# Patient Record
Sex: Male | Born: 1960 | Race: Black or African American | Hispanic: No | Marital: Married | Smoking: Never smoker
Health system: Southern US, Community
[De-identification: ages and names within clinical notes are randomized; demographics above are authoritative.]

## PROBLEM LIST (undated history)

## (undated) DIAGNOSIS — N529 Male erectile dysfunction, unspecified: Secondary | ICD-10-CM

## (undated) DIAGNOSIS — T8859XA Other complications of anesthesia, initial encounter: Secondary | ICD-10-CM

## (undated) DIAGNOSIS — F32A Depression, unspecified: Secondary | ICD-10-CM

## (undated) DIAGNOSIS — G4733 Obstructive sleep apnea (adult) (pediatric): Secondary | ICD-10-CM

## (undated) DIAGNOSIS — I10 Essential (primary) hypertension: Secondary | ICD-10-CM

## (undated) DIAGNOSIS — R3914 Feeling of incomplete bladder emptying: Secondary | ICD-10-CM

## (undated) DIAGNOSIS — R972 Elevated prostate specific antigen [PSA]: Secondary | ICD-10-CM

## (undated) DIAGNOSIS — H4010X0 Unspecified open-angle glaucoma, stage unspecified: Secondary | ICD-10-CM

## (undated) DIAGNOSIS — Z973 Presence of spectacles and contact lenses: Secondary | ICD-10-CM

## (undated) DIAGNOSIS — T4145XA Adverse effect of unspecified anesthetic, initial encounter: Secondary | ICD-10-CM

## (undated) DIAGNOSIS — G4730 Sleep apnea, unspecified: Secondary | ICD-10-CM

## (undated) DIAGNOSIS — Z9989 Dependence on other enabling machines and devices: Secondary | ICD-10-CM

## (undated) DIAGNOSIS — F329 Major depressive disorder, single episode, unspecified: Secondary | ICD-10-CM

## (undated) DIAGNOSIS — T7840XA Allergy, unspecified, initial encounter: Secondary | ICD-10-CM

## (undated) DIAGNOSIS — N401 Enlarged prostate with lower urinary tract symptoms: Secondary | ICD-10-CM

## (undated) DIAGNOSIS — G43909 Migraine, unspecified, not intractable, without status migrainosus: Secondary | ICD-10-CM

## (undated) HISTORY — DX: Essential (primary) hypertension: I10

## (undated) HISTORY — PX: TONSILLECTOMY: SUR1361

## (undated) HISTORY — DX: Male erectile dysfunction, unspecified: N52.9

## (undated) HISTORY — DX: Allergy, unspecified, initial encounter: T78.40XA

## (undated) HISTORY — DX: Elevated prostate specific antigen (PSA): R97.20

## (undated) HISTORY — DX: Major depressive disorder, single episode, unspecified: F32.9

## (undated) HISTORY — DX: Depression, unspecified: F32.A

## (undated) HISTORY — DX: Migraine, unspecified, not intractable, without status migrainosus: G43.909

## (undated) HISTORY — DX: Sleep apnea, unspecified: G47.30

## (undated) HISTORY — PX: GLAUCOMA SURGERY: SHX656

---

## 1998-10-21 ENCOUNTER — Ambulatory Visit (HOSPITAL_BASED_OUTPATIENT_CLINIC_OR_DEPARTMENT_OTHER): Admission: RE | Admit: 1998-10-21 | Discharge: 1998-10-21 | Payer: Self-pay | Admitting: *Deleted

## 2000-11-05 HISTORY — PX: INGUINAL HERNIA REPAIR: SUR1180

## 2001-07-27 ENCOUNTER — Emergency Department (HOSPITAL_COMMUNITY): Admission: EM | Admit: 2001-07-27 | Discharge: 2001-07-27 | Payer: Self-pay | Admitting: Emergency Medicine

## 2004-11-05 HISTORY — PX: UMBILICAL HERNIA REPAIR: SHX196

## 2006-12-05 ENCOUNTER — Ambulatory Visit: Payer: Self-pay | Admitting: Internal Medicine

## 2006-12-05 LAB — CONVERTED CEMR LAB
Alkaline Phosphatase: 81 units/L (ref 39–117)
BUN: 13 mg/dL (ref 6–23)
Basophils Absolute: 0 10*3/uL (ref 0.0–0.1)
CO2: 31 meq/L (ref 19–32)
Creatinine, Ser: 1.1 mg/dL (ref 0.4–1.5)
Eosinophils Absolute: 0.1 10*3/uL (ref 0.0–0.6)
Eosinophils Relative: 1.4 % (ref 0.0–5.0)
Lymphocytes Relative: 29.6 % (ref 12.0–46.0)
MCHC: 34.2 g/dL (ref 30.0–36.0)
MCV: 89.4 fL (ref 78.0–100.0)
Monocytes Relative: 8.1 % (ref 3.0–11.0)
Neutro Abs: 4.6 10*3/uL (ref 1.4–7.7)
Platelets: 272 10*3/uL (ref 150–400)
Potassium: 3.9 meq/L (ref 3.5–5.1)
RBC: 4.94 M/uL (ref 4.22–5.81)
Sodium: 139 meq/L (ref 135–145)
Total Bilirubin: 0.9 mg/dL (ref 0.3–1.2)
Total Protein: 7.4 g/dL (ref 6.0–8.3)

## 2006-12-26 ENCOUNTER — Ambulatory Visit: Payer: Self-pay | Admitting: Internal Medicine

## 2007-04-11 ENCOUNTER — Ambulatory Visit: Payer: Self-pay | Admitting: Internal Medicine

## 2007-04-11 DIAGNOSIS — G43909 Migraine, unspecified, not intractable, without status migrainosus: Secondary | ICD-10-CM | POA: Insufficient documentation

## 2007-04-28 ENCOUNTER — Ambulatory Visit: Payer: Self-pay | Admitting: Family Medicine

## 2007-10-03 ENCOUNTER — Encounter: Payer: Self-pay | Admitting: Family Medicine

## 2008-11-14 ENCOUNTER — Ambulatory Visit: Payer: Self-pay | Admitting: Diagnostic Radiology

## 2008-11-14 ENCOUNTER — Emergency Department (HOSPITAL_BASED_OUTPATIENT_CLINIC_OR_DEPARTMENT_OTHER): Admission: EM | Admit: 2008-11-14 | Discharge: 2008-11-14 | Payer: Self-pay | Admitting: Emergency Medicine

## 2009-05-30 ENCOUNTER — Ambulatory Visit: Payer: Self-pay | Admitting: Internal Medicine

## 2009-05-30 DIAGNOSIS — N400 Enlarged prostate without lower urinary tract symptoms: Secondary | ICD-10-CM

## 2009-05-30 LAB — CONVERTED CEMR LAB
Bilirubin Urine: NEGATIVE
Specific Gravity, Urine: 1.015
Urobilinogen, UA: 0.2
WBC Urine, dipstick: NEGATIVE
pH: 6

## 2009-05-31 ENCOUNTER — Ambulatory Visit: Payer: Self-pay | Admitting: Internal Medicine

## 2009-05-31 LAB — CONVERTED CEMR LAB
Chlamydia, Swab/Urine, PCR: NEGATIVE
GC Probe Amp, Urine: NEGATIVE
RBC / HPF: NONE SEEN (ref <3)
WBC, UA: NONE SEEN cells/hpf (ref <3)

## 2009-06-01 ENCOUNTER — Encounter: Payer: Self-pay | Admitting: Internal Medicine

## 2009-06-07 LAB — CONVERTED CEMR LAB
Basophils Absolute: 0.1 10*3/uL (ref 0.0–0.1)
CO2: 29 meq/L (ref 19–32)
Chloride: 107 meq/L (ref 96–112)
Hemoglobin: 15.2 g/dL (ref 13.0–17.0)
Lymphocytes Relative: 30.8 % (ref 12.0–46.0)
Monocytes Relative: 6.2 % (ref 3.0–12.0)
Neutro Abs: 4.3 10*3/uL (ref 1.4–7.7)
Neutrophils Relative %: 59.5 % (ref 43.0–77.0)
Platelets: 208 10*3/uL (ref 150.0–400.0)
Potassium: 3.5 meq/L (ref 3.5–5.1)
RDW: 12.7 % (ref 11.5–14.6)
Sodium: 142 meq/L (ref 135–145)

## 2009-08-04 ENCOUNTER — Telehealth (INDEPENDENT_AMBULATORY_CARE_PROVIDER_SITE_OTHER): Payer: Self-pay | Admitting: *Deleted

## 2009-08-05 ENCOUNTER — Encounter (INDEPENDENT_AMBULATORY_CARE_PROVIDER_SITE_OTHER): Payer: Self-pay | Admitting: *Deleted

## 2009-10-10 ENCOUNTER — Ambulatory Visit: Payer: Self-pay | Admitting: Internal Medicine

## 2009-10-13 ENCOUNTER — Telehealth (INDEPENDENT_AMBULATORY_CARE_PROVIDER_SITE_OTHER): Payer: Self-pay | Admitting: *Deleted

## 2009-10-13 DIAGNOSIS — R972 Elevated prostate specific antigen [PSA]: Secondary | ICD-10-CM

## 2009-10-18 ENCOUNTER — Encounter (INDEPENDENT_AMBULATORY_CARE_PROVIDER_SITE_OTHER): Payer: Self-pay | Admitting: *Deleted

## 2009-11-22 ENCOUNTER — Encounter: Payer: Self-pay | Admitting: Internal Medicine

## 2009-12-06 ENCOUNTER — Encounter: Payer: Self-pay | Admitting: Internal Medicine

## 2009-12-06 HISTORY — PX: PROSTATE BIOPSY: SHX241

## 2009-12-16 ENCOUNTER — Encounter: Payer: Self-pay | Admitting: Internal Medicine

## 2010-03-15 ENCOUNTER — Encounter: Payer: Self-pay | Admitting: Internal Medicine

## 2010-07-17 ENCOUNTER — Encounter: Payer: Self-pay | Admitting: Internal Medicine

## 2010-12-05 NOTE — Letter (Signed)
Summary: [facility]  [facility]   Imported By: Lanelle Bal 12/02/2009 08:56:30  _____________________________________________________________________  External Attachment:    Type:   Image     Comment:   External Document

## 2010-12-05 NOTE — Letter (Signed)
Summary: [facility] f/u [facility]  [facility]   Imported By: Lanelle Bal 07/27/2010 08:28:44  _____________________________________________________________________  External Attachment:    Type:   Image     Comment:   External Document

## 2010-12-05 NOTE — Letter (Signed)
Summary: [facility]  [facility]   Imported By: Lanelle Bal 12/22/2009 12:27:12  _____________________________________________________________________  External Attachment:    Type:   Image     Comment:   External Document

## 2010-12-05 NOTE — Letter (Signed)
Summary: [facility] --f/u BPH, elevated PSA   [facility]   Imported By: Lanelle Bal 03/23/2010 13:06:45  _____________________________________________________________________  External Attachment:    Type:   Image     Comment:   External Document

## 2011-02-19 LAB — BASIC METABOLIC PANEL
BUN: 13 mg/dL (ref 6–23)
CO2: 31 mEq/L (ref 19–32)
Chloride: 100 mEq/L (ref 96–112)
Creatinine, Ser: 1 mg/dL (ref 0.4–1.5)
Potassium: 4.2 mEq/L (ref 3.5–5.1)

## 2011-02-19 LAB — DIFFERENTIAL
Basophils Relative: 0 % (ref 0–1)
Eosinophils Absolute: 0.1 10*3/uL (ref 0.0–0.7)
Eosinophils Relative: 1 % (ref 0–5)
Monocytes Relative: 6 % (ref 3–12)
Neutrophils Relative %: 75 % (ref 43–77)

## 2011-02-19 LAB — POCT TOXICOLOGY PANEL: Benzodiazepines: POSITIVE

## 2011-02-19 LAB — CBC
HCT: 47.6 % (ref 39.0–52.0)
MCHC: 33.2 g/dL (ref 30.0–36.0)
MCV: 86.8 fL (ref 78.0–100.0)
Platelets: 286 10*3/uL (ref 150–400)
RBC: 5.48 MIL/uL (ref 4.22–5.81)

## 2011-02-19 LAB — POCT CARDIAC MARKERS
Myoglobin, poc: 61.3 ng/mL (ref 12–200)
Troponin i, poc: <0.05 ng/mL (ref 0.00–0.09)

## 2011-11-06 HISTORY — PX: GLAUCOMA SURGERY: SHX656

## 2012-03-21 ENCOUNTER — Institutional Professional Consult (permissible substitution): Payer: Self-pay | Admitting: Pulmonary Disease

## 2012-05-13 ENCOUNTER — Encounter: Payer: Self-pay | Admitting: *Deleted

## 2012-05-14 ENCOUNTER — Ambulatory Visit (INDEPENDENT_AMBULATORY_CARE_PROVIDER_SITE_OTHER): Payer: BC Managed Care – PPO | Admitting: Pulmonary Disease

## 2012-05-14 ENCOUNTER — Encounter: Payer: Self-pay | Admitting: Pulmonary Disease

## 2012-05-14 VITALS — BP 140/98 | HR 69 | Temp 97.7°F | Ht 68.0 in | Wt 197.0 lb

## 2012-05-14 DIAGNOSIS — G4733 Obstructive sleep apnea (adult) (pediatric): Secondary | ICD-10-CM

## 2012-05-14 NOTE — Assessment & Plan Note (Signed)
He has history of sleep apnea.  He has done well with CPAP.  He reports compliance and benefit from therapy.  His recent CPAP download showed excellent control.  I have explained how sleep apnea can affect the patient's health.  Driving precautions and importance of weight loss were discussed.  Treatment options for sleep apnea were reviewed.  Will arrange for new DME for his CPAP supplies.  He is to continue with CPAP 9 cm H2O.

## 2012-05-14 NOTE — Progress Notes (Deleted)
  Subjective:    Patient ID: Daryl Adkins, male    DOB: 1961-07-02, 51 y.o.   MRN: [id number]  HPI    Review of Systems  Constitutional: Negative for fever, appetite change and unexpected weight change.  HENT: Negative for ear pain, congestion, sore throat, sneezing, trouble swallowing and dental problem.   Respiratory: Negative for cough and shortness of breath.   Cardiovascular: Negative for chest pain, palpitations and leg swelling.  Musculoskeletal: Negative for joint swelling.  Skin: Negative for rash.  Neurological: Negative for headaches.  Psychiatric/Behavioral: Negative for dysphoric mood. The patient is not nervous/anxious.        Objective:   Physical Exam        Assessment & Plan:

## 2012-05-14 NOTE — Patient Instructions (Signed)
Will arrange for new home care company to arrange for new CPAP supplies Follow up in one year

## 2012-05-14 NOTE — Progress Notes (Signed)
Chief Complaint  Patient presents with  . Sleep Consult    Pt here to establish a sleep MD. Pt currently on cpap machine wears it everynight x 5-6 hrs a night. Pt is needing a new mask. Pt is still waking up once a night    History of Present Illness: Daryl Adkins is a 51 y.o. male for evaluation of OSA.  He was followed at headache clinic.  He was diagnosed with sleep apnea.  He has been using CPAP.  The sleep physician at the headache clinic is no longer practicing in [place].  As a result he needed a new sleep physician.  Prior to staring CPAP he used to get frequent headaches.  His wife also told him he would snore and stop breathing.  He had sleep study about 3 years ago, and was told he has sleep apnea.  He was started on CPAP, and instantly noticed a difference.  He would no longer snore, his headaches improved, and he was more alert during the day.  He no longer needs to take naps during the day.  He is not using anything to help him stay awake during the day.  He has been using CPAP 9 cm H2O with nasal pillows.  He used to get his CPAP supplies through the headache clinic.  He goes to bed at 10 pm.  He usually falls asleep quickly.  He will sometimes need to take a benadryl to fall asleep.  He frequently wakes up around 2 am.  He is not sure what wakes him up.  He then can have trouble falling back to sleep.  He will sometimes go for a walk, or sit on his porch.  He will then go back to bed, but will end up counting the minutes go by on his clock.  His Epworth score is 6 out of 24.  The patient denies sleep walking, sleep talking, bruxism, or nightmares.  There is no history of restless legs.  The patient denies sleep hallucinations, sleep paralysis, or cataplexy.   Past Medical History  Diagnosis Date  . Migraine headache   . Hypertension   . OSA (obstructive sleep apnea)   . Glaucoma     Past Surgical History  Procedure Date  . Hernia repair     umbilical and B groin   . Refractive surgery     Current Outpatient Prescriptions on File Prior to Visit  Medication Sig Dispense Refill  . amLODipine (NORVASC) 5 MG tablet Take 5 mg by mouth daily.      Marland Kitchen TREXIMET 85-500 MG per tablet 1 tablet as needed for headaches        No Known Allergies  No family history on file.  History  Substance Use Topics  . Smoking status: Never Smoker   . Smokeless tobacco: Never Used  . Alcohol Use: No    Review of Systems  Constitutional: Negative for fever, appetite change and unexpected weight change.  HENT: Negative for ear pain, congestion, sore throat, sneezing, trouble swallowing and dental problem.   Respiratory: Negative for cough and shortness of breath.   Cardiovascular: Negative for chest pain, palpitations and leg swelling.  Musculoskeletal: Negative for joint swelling.  Skin: Negative for rash.  Neurological: Negative for headaches.  Psychiatric/Behavioral: Negative for dysphoric mood. The patient is not nervous/anxious.     Physical Exam: Filed Vitals:   05/14/12 0859  BP: 140/98  Pulse: 69  Temp: 97.7 F (36.5 C)  TempSrc: Oral  Height: 5'  8" (1.727 m)  Weight: 197 lb (89.359 kg)  SpO2: 97%  ,  Current Encounter SPO2  05/14/12 0859 97%   Wt Readings from Last 3 Encounters:  05/14/12 197 lb (89.359 kg)  05/30/09 191 lb 12.8 oz (87 kg)  04/28/07 183 lb (83.008 kg)   Body mass index is 29.95 kg/(m^2).  General - No distress ENT - No sinus tenderness, no nasal discharge, MP 4, enlarged tongue, 2+ tonsils, no oral exudate, no LAN, no thyromegaly Cardiac - S1 and S2 normal, no murmurs, clicks, gallops or rubs. Regular rate and rhythm.  No edema. Chest - Chest is clear, no wheezing or rales. Normal symmetric air entry throughout both lung fields. No chest wall deformities or tenderness. Back - no focal tenderness on palpation Abd - soft without tenderness. Bowel sounds are normal. Ext - good pulses, and motor strength. Neuro - Cranial  nerves are normal. PERLA. EOM's intact. Skin - no discernible active dermatitis, erythema, urticaria or inflammatory process. Psych - normal mood, and behavior.  CPAP 11/01/11 to 05/13/12>>Used on 180 of 195 nights with average 5 hrs 36 min.  Average AHI 2.1 with CPAP 9 cm H2O.  Assessment/Plan:  Coralyn Helling, MD [place] [facility]/Sleep Pager:  [phone] 05/14/2012, 9:24 AM  Patient Instructions  Will arrange for new home care company to arrange for new CPAP supplies Follow up in one year

## 2012-08-26 DIAGNOSIS — H401190 Primary open-angle glaucoma, unspecified eye, stage unspecified: Secondary | ICD-10-CM | POA: Insufficient documentation

## 2012-08-26 DIAGNOSIS — H2510 Age-related nuclear cataract, unspecified eye: Secondary | ICD-10-CM | POA: Insufficient documentation

## 2013-04-06 ENCOUNTER — Ambulatory Visit (INDEPENDENT_AMBULATORY_CARE_PROVIDER_SITE_OTHER): Payer: BC Managed Care – PPO | Admitting: Internal Medicine

## 2013-04-06 ENCOUNTER — Encounter: Payer: Self-pay | Admitting: Internal Medicine

## 2013-04-06 VITALS — BP 148/90 | HR 62 | Temp 98.2°F | Ht 69.5 in | Wt 199.0 lb

## 2013-04-06 DIAGNOSIS — Z23 Encounter for immunization: Secondary | ICD-10-CM

## 2013-04-06 DIAGNOSIS — I10 Essential (primary) hypertension: Secondary | ICD-10-CM

## 2013-04-06 DIAGNOSIS — Z0000 Encounter for general adult medical examination without abnormal findings: Secondary | ICD-10-CM

## 2013-04-06 DIAGNOSIS — H409 Unspecified glaucoma: Secondary | ICD-10-CM | POA: Insufficient documentation

## 2013-04-06 LAB — BASIC METABOLIC PANEL
CO2: 26 mEq/L (ref 19–32)
Calcium: 9.5 mg/dL (ref 8.4–10.5)
Chloride: 105 mEq/L (ref 96–112)
Glucose, Bld: 86 mg/dL (ref 70–99)
Potassium: 3.9 mEq/L (ref 3.5–5.1)
Sodium: 140 mEq/L (ref 135–145)

## 2013-04-06 MED ORDER — LOSARTAN POTASSIUM 50 MG PO TABS: 50.0000 mg | ORAL_TABLET | Freq: Every day | ORAL | Status: DC

## 2013-04-06 NOTE — Progress Notes (Signed)
  Subjective:    Patient ID: Daryl Adkins, male    DOB: 1960/11/20, 52 y.o.   MRN: [id number]  HPI New patient , CPX  Past Medical History  Diagnosis Date  . Migraine headache   . Hypertension     started meds 2013   . OSA (obstructive sleep apnea)   . Glaucoma   . BPH (benign prostatic hyperplasia)    Past Surgical History  Procedure Laterality Date  . Hernia repair      umbilical and B groin  . Refractive surgery    . Glaucoma surgery  2013    at [facility]  . Prostate biopsy  12-2009    no cancer    History   Social History  . Marital Status: Married    Spouse Name: N/A    Number of Children: 5  . Years of Education: N/A   Occupational History  . [employer]    Social History Main Topics  . Smoking status: Never Smoker   . Smokeless tobacco: Never Used  . Alcohol Use: No  . Drug Use: No  . Sexually Active: Not on file   Other Topics Concern  . Not on file   Social History Narrative   Lives w/ wife   Family History  Problem Relation Age of Onset  . Colon cancer Neg Hx   . Prostate cancer Neg Hx   . Diabetes Mother   . CAD Neg Hx   . Stroke Neg Hx   . Hypertension Mother      Review of Systems Diet--regular, he eats fast food frequently. Exercise---has a sedentary lifestyle. No chest pain or shortness or breath No nausea, vomiting, diarrhea. No lower extremity edema. No cough or wheezing. No anxiety or depression.     Objective:   Physical Exam BP 148/90  Pulse 62  Temp(Src) 98.2 F (36.8 C) (Oral)  Ht 5' 9.5" (1.765 m)  Wt 199 lb (90.266 kg)  BMI 28.98 kg/m2  SpO2 98%  General -- alert, well-developed, NAD .   Neck --no thyromegaly , normal carotid pulse Lungs -- normal respiratory effort, no intercostal retractions, no accessory muscle use, and normal breath sounds.   Heart-- normal rate, regular rhythm, no murmur, and no gallop.   Abdomen--soft, non-tender, no distention, no masses, no HSM, no guarding, and no rigidity.   Extremities-- no  pretibial edema bilaterally Neurologic-- alert & oriented X3 and strength normal in all extremities. Psych-- Cognition and judgment appear intact. Alert and cooperative with normal attention span and concentration.  not anxious appearing and not depressed appearing.      Assessment & Plan:

## 2013-04-06 NOTE — Assessment & Plan Note (Addendum)
On amlodipine, often times in the high 140s, 150 "even when i take medication every day". Discussed riskof uncontrolled BP: MI stroke Plan: BMP Add losartan Compliance! Redo labs 2 weeks, see instructions

## 2013-04-06 NOTE — Assessment & Plan Note (Addendum)
Tdap > 10 years ago, provided one today Will discuss zostavax next year  Labs in 2 weeks, he is not fasting EKG sinus brady, no old EKG Diet-exercise discussed PSA per urology Colon ca screening, discussed cscope vs IFOB,iFOB provided but will call when interested in a cscope

## 2013-04-06 NOTE — Patient Instructions (Addendum)
Take  Amlodipine and  losartan every day. Check the  blood pressure 2 or 3 times a week, be sure it is between 110/60 and 140/85. If it is consistently higher or lower, let me know --- Come back in 2-3 weeks Fasting: FLP, CMP, TSH, CBC--- dx v70 --- next visit in 6 months

## 2013-04-08 ENCOUNTER — Encounter: Payer: Self-pay | Admitting: *Deleted

## 2013-04-10 ENCOUNTER — Other Ambulatory Visit (INDEPENDENT_AMBULATORY_CARE_PROVIDER_SITE_OTHER): Payer: BC Managed Care – PPO

## 2013-04-10 DIAGNOSIS — Z0000 Encounter for general adult medical examination without abnormal findings: Secondary | ICD-10-CM

## 2013-04-24 ENCOUNTER — Telehealth: Payer: Self-pay | Admitting: Pulmonary Disease

## 2013-04-24 NOTE — Telephone Encounter (Signed)
°  Called patient and left messages x3. Sent letter 04/24/13 °

## 2013-05-06 ENCOUNTER — Other Ambulatory Visit: Payer: Self-pay | Admitting: Internal Medicine

## 2013-05-06 DIAGNOSIS — Z0000 Encounter for general adult medical examination without abnormal findings: Secondary | ICD-10-CM

## 2013-05-06 NOTE — Telephone Encounter (Signed)
Pt has a lab appt 7.14.14 Refill done.

## 2013-05-18 ENCOUNTER — Telehealth: Payer: Self-pay | Admitting: Internal Medicine

## 2013-05-18 ENCOUNTER — Other Ambulatory Visit (INDEPENDENT_AMBULATORY_CARE_PROVIDER_SITE_OTHER): Payer: BC Managed Care – PPO

## 2013-05-18 DIAGNOSIS — I10 Essential (primary) hypertension: Secondary | ICD-10-CM

## 2013-05-18 DIAGNOSIS — Z0000 Encounter for general adult medical examination without abnormal findings: Secondary | ICD-10-CM

## 2013-05-18 LAB — COMPREHENSIVE METABOLIC PANEL
ALT: 20 U/L (ref 0–53)
Alkaline Phosphatase: 78 U/L (ref 39–117)
CO2: 26 mEq/L (ref 19–32)
Creatinine, Ser: 1.2 mg/dL (ref 0.4–1.5)
GFR: 82.97 mL/min (ref >60.00)
Sodium: 139 mEq/L (ref 135–145)
Total Bilirubin: 0.5 mg/dL (ref 0.3–1.2)
Total Protein: 7.8 g/dL (ref 6.0–8.3)

## 2013-05-18 LAB — CBC WITH DIFFERENTIAL/PLATELET
Basophils Relative: 0.4 % (ref 0.0–3.0)
Eosinophils Absolute: 0.2 10*3/uL (ref 0.0–0.7)
HCT: 45.5 % (ref 39.0–52.0)
Hemoglobin: 15.1 g/dL (ref 13.0–17.0)
Lymphocytes Relative: 28.6 % (ref 12.0–46.0)
Lymphs Abs: 2.7 10*3/uL (ref 0.7–4.0)
MCHC: 33.2 g/dL (ref 30.0–36.0)
Monocytes Relative: 6.8 % (ref 3.0–12.0)
Neutro Abs: 6 10*3/uL (ref 1.4–7.7)
RBC: 5.05 Mil/uL (ref 4.22–5.81)
RDW: 13.8 % (ref 11.5–14.6)

## 2013-05-18 LAB — LIPID PANEL
Cholesterol: 137 mg/dL (ref 0–200)
HDL: 34.5 mg/dL — ABNORMAL LOW (ref >39.00)
Total CHOL/HDL Ratio: 4
Triglycerides: 254 mg/dL — ABNORMAL HIGH (ref 0.0–149.0)
VLDL: 50.8 mg/dL — ABNORMAL HIGH (ref 0.0–40.0)

## 2013-05-18 NOTE — Telephone Encounter (Signed)
Overdue for labs: FLP, CMP, TSH, CBC--- dx v70 Please arrange

## 2013-05-21 ENCOUNTER — Telehealth: Payer: Self-pay | Admitting: *Deleted

## 2013-05-21 ENCOUNTER — Other Ambulatory Visit (INDEPENDENT_AMBULATORY_CARE_PROVIDER_SITE_OTHER): Payer: BC Managed Care – PPO

## 2013-05-21 DIAGNOSIS — E039 Hypothyroidism, unspecified: Secondary | ICD-10-CM

## 2013-05-21 LAB — T4, FREE: Free T4: 0.64 ng/dL (ref 0.60–1.60)

## 2013-05-21 NOTE — Telephone Encounter (Signed)
lmovm to return call  °

## 2013-05-21 NOTE — Telephone Encounter (Signed)
Discontinue amlodipine 5 mg. Start amlodipine 10 mg one by mouth daily #30 and 6 refills. Continue checking BPs, call with  BP log in 3 weeks.

## 2013-05-21 NOTE — Telephone Encounter (Signed)
Message copied by Shirlee More I on Thu May 21, 2013  9:52 AM ------      Message from: Willow Ora E      Created: Thu May 21, 2013  9:27 AM       Call patient:      Cholesterol is satisfactory, triglycerides slightly elevated, no need for medication, a healthier diet and exercise daily will help decrease the triglycerides.      His thyroid may be under working, please add or redraw:  free T3, free T4--- dx  hypothyroidism.      Other results normal.      Please ask the patient if his BP is well-controlled, if is not--->  let me know.       ------

## 2013-05-21 NOTE — Telephone Encounter (Signed)
Discussed with patient, lab orders placed and appt. Scheduled lab appointment. Pt. States his blood pressure has been 146/90 and 140/90. Please advise if further instruction to the patient is needed. Thanks.

## 2013-05-22 MED ORDER — AMLODIPINE BESYLATE 10 MG PO TABS: 10.0000 mg | ORAL_TABLET | Freq: Every day | ORAL | Status: DC

## 2013-05-22 NOTE — Telephone Encounter (Signed)
Discussed with patient, verbalized understanding. Amlodipine 5mg  d.c per orders and new rx sent to pharmacy. Pt. States he will call with Bp log.

## 2013-05-26 ENCOUNTER — Encounter: Payer: Self-pay | Admitting: *Deleted

## 2013-06-04 ENCOUNTER — Ambulatory Visit: Payer: BC Managed Care – PPO | Admitting: Pulmonary Disease

## 2013-06-21 ENCOUNTER — Other Ambulatory Visit: Payer: Self-pay | Admitting: Internal Medicine

## 2013-06-22 NOTE — Telephone Encounter (Signed)
Refill done per protocol.  

## 2013-07-27 ENCOUNTER — Ambulatory Visit: Payer: BC Managed Care – PPO | Admitting: Pulmonary Disease

## 2013-08-24 ENCOUNTER — Ambulatory Visit: Payer: BC Managed Care – PPO | Admitting: Pulmonary Disease

## 2013-10-02 ENCOUNTER — Telehealth: Payer: Self-pay | Admitting: Pulmonary Disease

## 2013-10-02 NOTE — Telephone Encounter (Signed)
Please advise if okay to [facility] your schedule to see this pt for regular f/u in Jan, thanks

## 2013-10-05 ENCOUNTER — Ambulatory Visit: Payer: BC Managed Care – PPO | Admitting: Pulmonary Disease

## 2013-10-05 NOTE — Telephone Encounter (Signed)
Okay to double book visit. 

## 2013-10-05 NOTE — Telephone Encounter (Signed)
lmomtcb x1 

## 2013-10-06 NOTE — Telephone Encounter (Signed)
appt set for 11-09-12 at 3pm. Carron Curie, CMA

## 2013-11-09 ENCOUNTER — Encounter: Payer: Self-pay | Admitting: Internal Medicine

## 2013-11-09 ENCOUNTER — Ambulatory Visit (INDEPENDENT_AMBULATORY_CARE_PROVIDER_SITE_OTHER): Payer: BC Managed Care – PPO | Admitting: Pulmonary Disease

## 2013-11-09 ENCOUNTER — Encounter: Payer: Self-pay | Admitting: Pulmonary Disease

## 2013-11-09 ENCOUNTER — Ambulatory Visit (INDEPENDENT_AMBULATORY_CARE_PROVIDER_SITE_OTHER): Payer: BC Managed Care – PPO | Admitting: Internal Medicine

## 2013-11-09 VITALS — BP 130/82 | HR 72 | Ht 68.0 in | Wt 213.0 lb

## 2013-11-09 VITALS — BP 125/76 | HR 70 | Temp 98.5°F | Wt 212.0 lb

## 2013-11-09 DIAGNOSIS — R946 Abnormal results of thyroid function studies: Secondary | ICD-10-CM

## 2013-11-09 DIAGNOSIS — I10 Essential (primary) hypertension: Secondary | ICD-10-CM

## 2013-11-09 DIAGNOSIS — G4733 Obstructive sleep apnea (adult) (pediatric): Secondary | ICD-10-CM

## 2013-11-09 LAB — BASIC METABOLIC PANEL
BUN: 13 mg/dL (ref 6–23)
CHLORIDE: 106 meq/L (ref 96–112)
CO2: 27 mEq/L (ref 19–32)
Calcium: 9.2 mg/dL (ref 8.4–10.5)
Creatinine, Ser: 1.1 mg/dL (ref 0.4–1.5)
GFR: 87.06 mL/min (ref >60.00)
GLUCOSE: 111 mg/dL — AB (ref 70–99)
POTASSIUM: 3.6 meq/L (ref 3.5–5.1)
SODIUM: 140 meq/L (ref 135–145)

## 2013-11-09 LAB — TSH: TSH: 8.81 u[IU]/mL — ABNORMAL HIGH (ref 0.35–5.50)

## 2013-11-09 LAB — T4, FREE: Free T4: 0.57 ng/dL — ABNORMAL LOW (ref 0.60–1.60)

## 2013-11-09 LAB — T3, FREE: T3, Free: 3.1 pg/mL (ref 2.3–4.2)

## 2013-11-09 NOTE — Progress Notes (Signed)
Pre visit review using our clinic review tool, if applicable. No additional management support is needed unless otherwise documented below in the visit note. 

## 2013-11-09 NOTE — Assessment & Plan Note (Signed)
He is compliant with CPAP and reports benefit.  Will get copy of his CPAP download and call him with results.  Discuss techniques to improve sleep hygiene.  Also discussed stimulus control and relaxation techniques.

## 2013-11-09 NOTE — Assessment & Plan Note (Signed)
Last TSH is slightly elevated, patient is asymptomatic. Plan: Labs

## 2013-11-09 NOTE — Progress Notes (Signed)
   Subjective:    Patient ID: Daryl Adkins, male    DOB: [birth year], 53 y.o.   MRN: [id number]  HPI ROV Today we discussed the following issues:  Hypertension--good medication compliance, ambulatory BPs are checked with a wrist cuff and readings range from 140s to 170s, sometimes readings are different even if taken within minutes, the patient wonders about accuracy. Also mild ED since amlodipine was increased from 5 to 10 mg Abnormal TSH--due for labs  Past Medical History  Diagnosis Date  . Migraine headache   . Hypertension     started meds 2013   . OSA (obstructive sleep apnea)   . Glaucoma   . BPH (benign prostatic hyperplasia)    Past Surgical History  Procedure Laterality Date  . Hernia repair      umbilical and B groin  . Refractive surgery    . [facility]  . Prostate biopsy  12-2009    no cancer      Review of Systems Diet, Exercise-- needs improvement  No  CP, SOB, lower extremity edema; no claudication no fatigue Denies  nausea, vomiting diarrhea Denies  blood in the stools No anxiety, depression     Objective:   Physical Exam BP 125/76  Pulse 70  Temp(Src) 98.5 F (36.9 C)  Wt 212 lb (96.163 kg)  SpO2 98% General -- alert, well-developed, NAD.  Lungs -- normal respiratory effort, no intercostal retractions, no accessory muscle use, and normal breath sounds.  Heart-- normal rate, regular rhythm, no murmur.  Extremities-- no pretibial edema bilaterally  Neurologic--  alert & oriented X3. Speech normal, gait normal, strength normal in all extremities.  Psych-- Cognition and judgment appear intact. Cooperative with normal attention span and concentration. No anxious or depressed appearing.     Assessment & Plan:

## 2013-11-09 NOTE — Assessment & Plan Note (Addendum)
Since the last time he was here describes good compliance with medication. Ambulatory BPs elevated, ok in the office, accurate outpt readings?   Also some ED since he increase amlodipine to 10 mg. Plan: Decrease amlodipine to 5 mg, continue monitoring BPs Labs If continue with ED patient will let me know

## 2013-11-09 NOTE — Progress Notes (Signed)
Chief Complaint  Patient presents with  . Sleep Apnea    Currently using CPAP machine every night for at least 5-6 hours. Denies problems with machine, mask or pressure.    History of Present Illness: Daryl RogersDarryl Adkins is a 53 y.o. male with OSA.  He is using nasal pillows and no issue with mask fit.  He goes to bed at 10 pm.  He falls asleep quickly, but sometimes wakes up at around 2 am.  He will sometimes have trouble falling back to sleep.  He gets out of bed at 530 am.  He feels rested in the morning, and does not feel like he has trouble with daytime sleepiness.  He is not using anything to help him sleep or stay awake.  TESTS: [facility]/07 [facility] >> AHI 9, SpO2 80% CPAP titration 10/24/07 >> CPAP 9 cm H2O CPAP 11/01/11 to 05/13/12 >> Used on 180 of 195 nights with average 5 hrs 36 min.  Average AHI 2.1 with CPAP 9 cm H2O.  Daryl Adkins  has a past medical history of Migraine headache; Hypertension; OSA (obstructive sleep apnea); Glaucoma; and BPH (benign prostatic hyperplasia).  Daryl Adkins  has past surgical history that includes Hernia repair; Refractive surgery; Glaucoma surgery (2013); and Prostate biopsy (12-2009).  Prior to Admission medications   Medication Sig Start Date End Date Taking? Authorizing Provider  amLODipine (NORVASC) 5 MG tablet Take 5 mg by mouth daily.   Yes Historical Provider, MD  finasteride (PROSCAR) 5 MG tablet Take 5 mg by mouth daily.  11/05/13  Yes Historical Provider, MD  latanoprost (XALATAN) 0.005 % ophthalmic solution Place 1 drop into both eyes at bedtime.  10/15/13  Yes Historical Provider, MD  losartan (COZAAR) 50 MG tablet TAKE 1 TABLET BY MOUTH DAILY. 06/21/13  Yes Wanda PlumpJose E Paz, MD  TREXIMET 85-500 MG per tablet 1 tablet as needed for headaches 02/13/12  Yes Historical Provider, MD    No Known Allergies   Physical Exam:  General - No distress ENT - No sinus tenderness, no oral exudate, no LAN Cardiac - s1s2  regular, no murmur Chest - No wheeze/rales/dullness Back - No focal tenderness Abd - Soft, non-tender Ext - No edema Neuro - Normal strength Skin - No rashes Psych - normal mood, and behavior   Assessment/Plan:  Coralyn HellingVineet Virgil Slinger, MD [place] [facility]/Critical [facility]/Sleep Pager:  [phone]

## 2013-11-09 NOTE — Patient Instructions (Signed)
Follow-up in one year.

## 2013-11-09 NOTE — Patient Instructions (Signed)
Get your blood work before you leave   Next visit is for a physical exam in 6 months  Please make an appointment    decrease amlodipine to 5 mg daily. Check the  blood pressure 2 or 3 times a week be sure it is between 110/60 and 140/85.  Please check it at your arm, not wrist ; you can check it at the store or pharmacy. Ideal blood pressure is 120/80. If it is consistently higher or lower, let me know

## 2013-11-12 ENCOUNTER — Encounter: Payer: Self-pay | Admitting: *Deleted

## 2013-11-16 ENCOUNTER — Other Ambulatory Visit (HOSPITAL_COMMUNITY): Payer: Self-pay | Admitting: Urology

## 2013-11-16 DIAGNOSIS — R972 Elevated prostate specific antigen [PSA]: Secondary | ICD-10-CM

## 2013-11-25 ENCOUNTER — Ambulatory Visit (HOSPITAL_COMMUNITY)
Admission: RE | Admit: 2013-11-25 | Discharge: 2013-11-25 | Disposition: A | Discharge status: Home / Self Care | Payer: BC Managed Care – PPO | Source: Ambulatory Visit | Attending: Urology | Admitting: Urology

## 2013-11-25 DIAGNOSIS — R972 Elevated prostate specific antigen [PSA]: Secondary | ICD-10-CM | POA: Insufficient documentation

## 2013-11-25 DIAGNOSIS — N402 Nodular prostate without lower urinary tract symptoms: Secondary | ICD-10-CM | POA: Insufficient documentation

## 2013-11-25 DIAGNOSIS — N400 Enlarged prostate without lower urinary tract symptoms: Secondary | ICD-10-CM | POA: Insufficient documentation

## 2013-11-25 MED ORDER — GADOBENATE DIMEGLUMINE 529 MG/ML IV SOLN
20.0000 mL | Freq: Once | INTRAVENOUS | Status: AC | PRN
  Administered 2013-11-25: 20 mL via INTRAVENOUS

## 2014-01-10 ENCOUNTER — Other Ambulatory Visit: Payer: Self-pay | Admitting: Internal Medicine

## 2014-01-10 DIAGNOSIS — I10 Essential (primary) hypertension: Secondary | ICD-10-CM

## 2014-01-11 NOTE — Telephone Encounter (Signed)
[facility]

## 2014-02-04 ENCOUNTER — Other Ambulatory Visit: Payer: Self-pay | Admitting: Internal Medicine

## 2014-03-07 ENCOUNTER — Telehealth: Payer: Self-pay | Admitting: Pulmonary Disease

## 2014-03-07 NOTE — Telephone Encounter (Signed)
CPAP 12/06/13 to 03/01/14 >> Used on 80 of 86 nights with average 6 hrs 12.  Average AHI 1.6 with CPAP 9 cm H2O.  Will have my nurse inform pt that CPAP report looks good.  No change to current set up.

## 2014-03-08 NOTE — Telephone Encounter (Signed)
Pt is aware of results. 

## 2014-04-12 ENCOUNTER — Encounter: Payer: BC Managed Care – PPO | Admitting: Internal Medicine

## 2014-04-21 ENCOUNTER — Other Ambulatory Visit: Payer: Self-pay | Admitting: Internal Medicine

## 2014-05-30 ENCOUNTER — Other Ambulatory Visit: Payer: Self-pay | Admitting: Internal Medicine

## 2014-06-02 ENCOUNTER — Telehealth: Payer: Self-pay | Admitting: *Deleted

## 2014-06-02 NOTE — Telephone Encounter (Signed)
Received Request for Supporting Documentation on CPAP/BIPAP Device paperwork via fax from [facility].  Placed in folder for Dr. Drue NovelPaz to review.//AB/CMA

## 2014-06-11 NOTE — Telephone Encounter (Signed)
Received Request for Supporting Documentation on CPAP/BIPAP paperwork from [facility] back from Dr. Drue NovelPaz with a note to fax to Pulmonary per Dr. Craige CottaSood.  Forms faxed.//AB/CMA

## 2014-07-14 ENCOUNTER — Other Ambulatory Visit: Payer: Self-pay | Admitting: Physician Assistant

## 2014-07-14 ENCOUNTER — Other Ambulatory Visit: Payer: Self-pay | Admitting: Internal Medicine

## 2014-07-15 NOTE — Telephone Encounter (Signed)
I refilled this medication once as the patient's PCP, Dr. Drue Novel was on vacation.  I will defer further refills to him.

## 2014-08-26 ENCOUNTER — Other Ambulatory Visit: Payer: Self-pay

## 2014-09-07 ENCOUNTER — Ambulatory Visit (INDEPENDENT_AMBULATORY_CARE_PROVIDER_SITE_OTHER): Payer: BC Managed Care – PPO | Admitting: Internal Medicine

## 2014-09-07 ENCOUNTER — Encounter: Payer: Self-pay | Admitting: Internal Medicine

## 2014-09-07 VITALS — BP 134/78 | HR 62 | Temp 97.9°F | Wt 206.2 lb

## 2014-09-07 DIAGNOSIS — I10 Essential (primary) hypertension: Secondary | ICD-10-CM

## 2014-09-07 DIAGNOSIS — R946 Abnormal results of thyroid function studies: Secondary | ICD-10-CM

## 2014-09-07 DIAGNOSIS — G4733 Obstructive sleep apnea (adult) (pediatric): Secondary | ICD-10-CM

## 2014-09-07 LAB — BASIC METABOLIC PANEL
BUN: 14 mg/dL (ref 6–23)
CHLORIDE: 108 meq/L (ref 96–112)
CO2: 26 mEq/L (ref 19–32)
Calcium: 9.3 mg/dL (ref 8.4–10.5)
Creatinine, Ser: 1.1 mg/dL (ref 0.4–1.5)
GFR: 85.91 mL/min (ref >60.00)
Glucose, Bld: 91 mg/dL (ref 70–99)
Potassium: 3.8 mEq/L (ref 3.5–5.1)
Sodium: 141 mEq/L (ref 135–145)

## 2014-09-07 LAB — CBC WITH DIFFERENTIAL/PLATELET
BASOS PCT: 0.5 % (ref 0.0–3.0)
Basophils Absolute: 0 10*3/uL (ref 0.0–0.1)
Eosinophils Absolute: 0.1 10*3/uL (ref 0.0–0.7)
Eosinophils Relative: 1.5 % (ref 0.0–5.0)
HCT: 46.1 % (ref 39.0–52.0)
Hemoglobin: 15.4 g/dL (ref 13.0–17.0)
LYMPHS PCT: 30.4 % (ref 12.0–46.0)
Lymphs Abs: 2.3 10*3/uL (ref 0.7–4.0)
MCHC: 33.4 g/dL (ref 30.0–36.0)
MCV: 86.8 fl (ref 78.0–100.0)
Monocytes Absolute: 0.4 10*3/uL (ref 0.1–1.0)
Monocytes Relative: 5.9 % (ref 3.0–12.0)
Neutro Abs: 4.7 10*3/uL (ref 1.4–7.7)
Neutrophils Relative %: 61.7 % (ref 43.0–77.0)
Platelets: 241 10*3/uL (ref 150.0–400.0)
RBC: 5.31 Mil/uL (ref 4.22–5.81)
RDW: 14.2 % (ref 11.5–15.5)
WBC: 7.7 10*3/uL (ref 4.0–10.5)

## 2014-09-07 LAB — T3, FREE: T3 FREE: 3.5 pg/mL (ref 2.3–4.2)

## 2014-09-07 LAB — T4, FREE: FREE T4: 0.72 ng/dL (ref 0.60–1.60)

## 2014-09-07 LAB — TSH: TSH: 6.65 u[IU]/mL — ABNORMAL HIGH (ref 0.35–4.50)

## 2014-09-07 MED ORDER — LOSARTAN POTASSIUM 50 MG PO TABS: ORAL_TABLET | ORAL | Status: DC

## 2014-09-07 MED ORDER — AMLODIPINE BESYLATE 10 MG PO TABS: ORAL_TABLET | ORAL | Status: DC

## 2014-09-07 NOTE — Assessment & Plan Note (Signed)
Compliance with CPAP, reports he is feeling very well.

## 2014-09-07 NOTE — Progress Notes (Signed)
   Subjective:    Patient ID: Daryl RogersDarryl Adkins, male    DOB: 06-29-1961, 53 y.o.   MRN: [id number]  DOS:  09/07/2014 Type of visit - description : rov Interval history: Hypertension, good medication compliance, ambulatory BPs always in the 130, 140 range. Sleep apnea, good compliance with CPAP, states he is doing great, it changed his life Abnormal TFTs, labs reviewed, due to repeat TSH    ROS No increasing weight. No chest pain or difficulty breathing No nausea, vomiting, diarrhea or constipation. No blood in the stools. Somnolence has essentially resolved with the use of a CPAP No anxiety or depression  Past Medical History  Diagnosis Date  . Migraine headache   . Hypertension     started meds 2013   . OSA (obstructive sleep apnea)   . Glaucoma   . BPH (benign prostatic hyperplasia)     Past Surgical History  Procedure Laterality Date  . Hernia repair      umbilical and B groin  . Refractive surgery    . [facility]  . Prostate biopsy  12-2009    no cancer     History   Social History  . Marital Status: Married    Spouse Name: N/A    Number of Children: 5  . Years of Education: N/A   Occupational History  . [employer]    Social History Main Topics  . Smoking status: Never Smoker   . Smokeless tobacco: Never Used  . Alcohol Use: No  . Drug Use: No  . Sexual Activity: Not on file   Other Topics Concern  . Not on file   Social History Narrative   Lives w/ wife        Medication List       This list is accurate as of: 09/07/14  5:49 PM.  Always use your most recent med list.               amLODipine 10 MG tablet  Commonly known as:  NORVASC  TAKE 1 TABLET BY MOUTH DAILY     finasteride 5 MG tablet  Commonly known as:  PROSCAR  Take 5 mg by mouth daily.     latanoprost 0.005 % ophthalmic solution  Commonly known as:  XALATAN  Place 1 drop into both eyes at bedtime.     losartan 50 MG tablet  Commonly known as:  COZAAR    Take 1 tablet daily.     TREXIMET 85-500 MG per tablet  Generic drug:  SUMAtriptan-naproxen  1 tablet as needed for headaches           Objective:   Physical Exam BP 134/78 mmHg  Pulse 62  Temp(Src) 97.9 F (36.6 C) (Oral)  Wt 206 lb 4 oz (93.554 kg)  SpO2 96% General -- alert, well-developed, NAD.   HEENT-- Not pale.  Lungs -- normal respiratory effort, no intercostal retractions, no accessory muscle use, and normal breath sounds.  Heart-- normal rate, regular rhythm, no murmur.  Extremities-- no pretibial edema bilaterally  Neurologic--  alert & oriented X3. Speech normal, gait appropriate for age, strength symmetric and appropriate for age.  Psych-- Cognition and judgment appear intact. Cooperative with normal attention span and concentration. No anxious or depressed appearing.       Assessment & Plan:

## 2014-09-07 NOTE — Assessment & Plan Note (Signed)
See previous visit,  amlodipine dose was decreased to 5 mg, good compliance with amlodipine and losartan. Erectile dysfunction resolved. Doing well, check labs.

## 2014-09-07 NOTE — Progress Notes (Signed)
Pre visit review using our clinic review tool, if applicable. No additional management support is needed unless otherwise documented below in the visit note. 

## 2014-09-07 NOTE — Patient Instructions (Signed)
Get your blood work before you leave     Please come back to the office in 6 months  for a physical exam. Come back fasting   

## 2014-09-07 NOTE — Assessment & Plan Note (Signed)
TSH elevated, patient is asymptomatic, will check labs

## 2015-04-12 LAB — PSA: PSA: 12.77

## 2015-04-19 ENCOUNTER — Encounter: Payer: BC Managed Care – PPO | Admitting: Internal Medicine

## 2015-06-22 ENCOUNTER — Telehealth: Payer: Self-pay | Admitting: Internal Medicine

## 2015-06-22 NOTE — Telephone Encounter (Signed)
pre visit letter mailed 06/22/15 °

## 2015-07-13 ENCOUNTER — Encounter: Payer: Self-pay | Admitting: Internal Medicine

## 2015-09-28 ENCOUNTER — Other Ambulatory Visit: Payer: Self-pay

## 2015-09-28 MED ORDER — LOSARTAN POTASSIUM 50 MG PO TABS: 50.0000 mg | ORAL_TABLET | Freq: Every day | ORAL | Status: DC

## 2015-11-14 ENCOUNTER — Encounter: Payer: Self-pay | Admitting: Internal Medicine

## 2015-11-30 ENCOUNTER — Other Ambulatory Visit: Payer: Self-pay

## 2015-11-30 MED ORDER — AMLODIPINE BESYLATE 10 MG PO TABS: 10.0000 mg | ORAL_TABLET | Freq: Every day | ORAL | Status: DC

## 2015-12-16 ENCOUNTER — Other Ambulatory Visit: Payer: Self-pay | Admitting: Internal Medicine

## 2016-01-02 ENCOUNTER — Encounter: Payer: Self-pay | Admitting: Internal Medicine

## 2016-01-11 ENCOUNTER — Ambulatory Visit: Payer: Self-pay | Admitting: Pulmonary Disease

## 2016-01-16 ENCOUNTER — Encounter: Payer: Self-pay | Admitting: Behavioral Health

## 2016-01-16 ENCOUNTER — Telehealth: Payer: Self-pay | Admitting: Behavioral Health

## 2016-01-16 NOTE — Telephone Encounter (Signed)
Pre-Visit Call completed with patient and chart updated.   Pre-Visit Info documented in Specialty Comments under [employer].    

## 2016-01-17 ENCOUNTER — Encounter: Payer: Self-pay | Admitting: Internal Medicine

## 2016-01-17 ENCOUNTER — Ambulatory Visit (INDEPENDENT_AMBULATORY_CARE_PROVIDER_SITE_OTHER): Payer: BLUE CROSS/BLUE SHIELD | Admitting: Internal Medicine

## 2016-01-17 VITALS — BP 118/66 | HR 66 | Temp 98.1°F | Ht 68.0 in | Wt 197.1 lb

## 2016-01-17 DIAGNOSIS — K469 Unspecified abdominal hernia without obstruction or gangrene: Secondary | ICD-10-CM

## 2016-01-17 DIAGNOSIS — Z23 Encounter for immunization: Secondary | ICD-10-CM | POA: Diagnosis not present

## 2016-01-17 DIAGNOSIS — Z1211 Encounter for screening for malignant neoplasm of colon: Secondary | ICD-10-CM | POA: Diagnosis not present

## 2016-01-17 DIAGNOSIS — Z09 Encounter for follow-up examination after completed treatment for conditions other than malignant neoplasm: Secondary | ICD-10-CM

## 2016-01-17 DIAGNOSIS — Z0000 Encounter for general adult medical examination without abnormal findings: Secondary | ICD-10-CM

## 2016-01-17 DIAGNOSIS — R768 Other specified abnormal immunological findings in serum: Secondary | ICD-10-CM

## 2016-01-17 DIAGNOSIS — R7989 Other specified abnormal findings of blood chemistry: Secondary | ICD-10-CM

## 2016-01-17 DIAGNOSIS — R894 Abnormal immunological findings in specimens from other organs, systems and tissues: Secondary | ICD-10-CM

## 2016-01-17 DIAGNOSIS — I10 Essential (primary) hypertension: Secondary | ICD-10-CM

## 2016-01-17 LAB — COMPLETE METABOLIC PANEL WITH GFR
ALBUMIN: 4.5 g/dL (ref 3.6–5.1)
ALK PHOS: 92 U/L (ref 40–115)
ALT: 27 U/L (ref 9–46)
AST: 22 U/L (ref 10–35)
BILIRUBIN TOTAL: 0.5 mg/dL (ref 0.2–1.2)
BUN: 14 mg/dL (ref 7–25)
CO2: 25 mmol/L (ref 20–31)
Calcium: 9.2 mg/dL (ref 8.6–10.3)
Chloride: 102 mmol/L (ref 98–110)
Creat: 1.13 mg/dL (ref 0.70–1.33)
GFR, Est African American: 84 mL/min (ref >=60)
GFR, Est Non African American: 73 mL/min (ref >=60)
Glucose, Bld: 75 mg/dL (ref 65–99)
Potassium: 3.8 mmol/L (ref 3.5–5.3)
SODIUM: 142 mmol/L (ref 135–146)
TOTAL PROTEIN: 7.9 g/dL (ref 6.1–8.1)

## 2016-01-17 MED ORDER — LOSARTAN POTASSIUM 50 MG PO TABS: 50.0000 mg | ORAL_TABLET | Freq: Every day | ORAL | Status: DC

## 2016-01-17 MED ORDER — AMLODIPINE BESYLATE 10 MG PO TABS: 10.0000 mg | ORAL_TABLET | Freq: Every day | ORAL | Status: DC

## 2016-01-17 NOTE — Assessment & Plan Note (Signed)
HTN -- good compliance with meds, BP today is very good, recommend ambulatory BPs Diastasis recti: Abdominal exam likely due to this condition however he is a little bulge near the umbilical area. Will refer to surgery, hernia? BPH: urology considering a  TURP HEP C TESTS:The patient had a negative hep C screening in 2015 however on 10/19/2015 hep C came back positive follow-up by a negative hepatitis C RNA. either he had a false positive hep C or a previous exposure. Recommend wife to be tested. Patient requested repeated labs. 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 Elevated TSH: Recheck a free T3, T4, TSH and anti-TPO RTC  6 months

## 2016-01-17 NOTE — Progress Notes (Signed)
Pre visit review using our clinic review tool, if applicable. No additional management support is needed unless otherwise documented below in the visit note. 

## 2016-01-17 NOTE — Assessment & Plan Note (Addendum)
Tdap , pneumonia shot today. Colon cancer screening modalities discussed, elected a GI referral PSA per urology  Diet and exercise discussed

## 2016-01-17 NOTE — Progress Notes (Signed)
Subjective:    Patient ID: Daryl Adkins, male    DOB: 1961-09-12, 55 y.o.   MRN: [id number]  DOS:  01/17/2016 Type of visit - description : CPX Interval history: Last visit 2015. In addition to his CPX we managed all his chronic medical problems. See assessment and plan. Good compliance with BP medications, due for labs and refills Abdominal hernia? See physical exam, denies pain  Review of Systems Constitutional: No fever. No chills. No unexplained wt changes. No unusual sweats  HEENT: No dental problems, no ear discharge, no facial swelling, no voice changes. No eye discharge, no eye  redness , no  intolerance to light   Respiratory: No wheezing , no  difficulty breathing. No cough , no mucus production  Cardiovascular: No CP, no leg swelling , no  Palpitations  GI: no nausea, no vomiting, no diarrhea , no  abdominal pain.  No blood in the stools. No dysphagia, no odynophagia    Endocrine: No polyphagia, no polyuria , no polydipsia  GU: No dysuria, gross hematuria, BPH follow-up by urology.  Musculoskeletal: No joint swellings or unusual aches or pains  Skin: No change in the color of the skin, palor , no  Rash  Allergic, immunologic: No environmental allergies , no  food allergies  Neurological: No dizziness no  syncope. No headaches. No diplopia, no slurred, no slurred speech, no motor deficits, no facial  Numbness  Hematological: No enlarged lymph nodes, no easy bruising , no unusual bleedings  Psychiatry: No suicidal ideas, no hallucinations, no beavior problems, no confusion.  No unusual/severe anxiety, no depression   Past Medical History  Diagnosis Date  . Migraine headache   . Hypertension     started meds 2013   . OSA (obstructive sleep apnea)   . Glaucoma   . BPH (benign prostatic hyperplasia)   . Elevated PSA   . Erectile dysfunction     Past Surgical History  Procedure Laterality Date  . Hernia repair      umbilical and B groin  .  Refractive surgery    . Glaucoma surgery  2013    at [facility]  . Prostate biopsy  12-2009    no cancer     Social History   Social History  . Marital Status: Married    Spouse Name: N/A  . Number of Children: 5  . Years of Education: N/A   Occupational History  . [employer]    Social History Main Topics  . Smoking status: Never Smoker   . Smokeless tobacco: Never Used  . Alcohol Use: No  . Drug Use: No  . Sexual Activity: Not on file   Other Topics Concern  . Not on file   Social History Narrative   Lives w/ wife   Family History  Problem Relation Age of Onset  . Colon cancer Neg Hx   . Prostate cancer Neg Hx   . Diabetes Mother   . CAD Neg Hx   . Stroke Neg Hx   . Hypertension Mother         Medication List       This list is accurate as of: 01/17/16  5:00 PM.  Always use your most recent med list.               amLODipine 10 MG tablet  Commonly known as:  NORVASC  Take 1 tablet (10 mg total) by mouth daily.     latanoprost 0.005 % ophthalmic solution  Commonly  known as:  XALATAN  Place 1 drop into both eyes at bedtime.     losartan 50 MG tablet  Commonly known as:  COZAAR  Take 1 tablet (50 mg total) by mouth daily.     tadalafil 5 MG tablet  Commonly known as:  CIALIS  Take 5 mg by mouth daily.           Objective:   Physical Exam  Abdominal:     BP 118/66 mmHg  Pulse 66  Temp(Src) 98.1 F (36.7 C) (Oral)  Ht 5\' 8"  (1.727 m)  Wt 197 lb 2 oz (89.415 kg)  BMI 29.98 kg/m2  SpO2 95% General:   Well developed, well nourished . NAD.  HEENT:  Normocephalic . Face symmetric, atraumatic Lungs:  CTA B Normal respiratory effort, no intercostal retractions, no accessory muscle use. Heart: RRR,  no murmur.  no pretibial edema bilaterally  Abdomen:  Not distended, soft, non-tender. No rebound or rigidity.  + diastesis recti but more noticeable on the right, see graphic. Skin: Not pale. Not jaundice Neurologic:  alert & oriented X3.  Speech  normal, gait appropriate for age and unassisted Psych--  Cognition and judgment appear intact.  Cooperative with normal attention span and concentration.  Behavior appropriate. No anxious or depressed appearing.      Assessment & Plan:   Assessment HTN started meds 2013 Migraine headaches Elevated TSH OSA-- on Cpap Glaucoma BPH, elevated PSA , (-) prostate BX 2011, Dr Patsi Searsannenbaum  PLAN: HTN -- good compliance with meds, BP today is very good, recommend ambulatory BPs Diastasis recti: Abdominal exam likely due to this condition however he is a little bulge near the umbilical area. Will refer to surgery, hernia? BPH: urology considering a  TURP HEP C TESTS:The patient had a negative hep C screening in 2015 however on 10/19/2015 hep C came back positive follow-up by a negative hepatitis C RNA. either he had a false positive hep C or a previous exposure. Recommend wife to be tested. Patient requested repeated labs. 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 Elevated TSH: Recheck a free T3, T4, TSH and anti-TPO RTC  6 months    Today, I spent more than  15 minutes  min with the patient (in addition to the CPX): >50% of the time counseling regards and managing his  chronic medical problems

## 2016-01-17 NOTE — Patient Instructions (Signed)
GO TO THE LAB : Get the blood work     GO TO THE FRONT DESK Schedule your next appointment for a  checkup When?   months Fasting?  No      Check the  blood pressure 2 times a month  Be sure your blood pressure is between 110/65 and  145/85. If it is consistently higher or lower, let me know

## 2016-01-18 LAB — LIPID PANEL
CHOLESTEROL: 129 mg/dL (ref 0–200)
HDL: 32.8 mg/dL — ABNORMAL LOW (ref >39.00)
LDL CALC: 67 mg/dL (ref 0–99)
NonHDL: 96.36
Total CHOL/HDL Ratio: 4
Triglycerides: 148 mg/dL (ref 0.0–149.0)
VLDL: 29.6 mg/dL (ref 0.0–40.0)

## 2016-01-18 LAB — CBC WITH DIFFERENTIAL/PLATELET
Basophils Absolute: 0 10*3/uL (ref 0.0–0.1)
Basophils Relative: 0.4 % (ref 0.0–3.0)
Eosinophils Absolute: 0.1 10*3/uL (ref 0.0–0.7)
Eosinophils Relative: 2.8 % (ref 0.0–5.0)
HEMATOCRIT: 44.9 % (ref 39.0–52.0)
HEMOGLOBIN: 15.1 g/dL (ref 13.0–17.0)
LYMPHS PCT: 22.9 % (ref 12.0–46.0)
Lymphs Abs: 1.2 10*3/uL (ref 0.7–4.0)
MCHC: 33.7 g/dL (ref 30.0–36.0)
MCV: 87.5 fl (ref 78.0–100.0)
Monocytes Absolute: 0.8 10*3/uL (ref 0.1–1.0)
Monocytes Relative: 15.7 % — ABNORMAL HIGH (ref 3.0–12.0)
NEUTROS PCT: 58.2 % (ref 43.0–77.0)
Neutro Abs: 3.1 10*3/uL (ref 1.4–7.7)
Platelets: 229 10*3/uL (ref 150.0–400.0)
RBC: 5.13 Mil/uL (ref 4.22–5.81)
RDW: 14.3 % (ref 11.5–15.5)
WBC: 5.3 10*3/uL (ref 4.0–10.5)

## 2016-01-18 LAB — HEPATITIS C ANTIBODY: HCV Ab: NEGATIVE

## 2016-01-18 LAB — HEPATITIS C RNA QUANTITATIVE: HCV Quantitative: NOT DETECTED IU/mL (ref <15)

## 2016-01-18 LAB — TSH: TSH: 4.61 u[IU]/mL — AB (ref 0.35–4.50)

## 2016-01-18 LAB — T4, FREE: Free T4: 0.65 ng/dL (ref 0.60–1.60)

## 2016-01-18 LAB — T3, FREE: T3 FREE: 2.9 pg/mL (ref 2.3–4.2)

## 2016-01-18 LAB — THYROID PEROXIDASE ANTIBODY: Thyroperoxidase Ab SerPl-aCnc: 506 IU/mL — ABNORMAL HIGH (ref <9)

## 2016-02-15 ENCOUNTER — Ambulatory Visit: Payer: Self-pay | Admitting: Pulmonary Disease

## 2016-04-04 ENCOUNTER — Other Ambulatory Visit: Payer: Self-pay | Admitting: Internal Medicine

## 2016-04-26 ENCOUNTER — Other Ambulatory Visit: Payer: Self-pay

## 2016-04-26 ENCOUNTER — Encounter (HOSPITAL_BASED_OUTPATIENT_CLINIC_OR_DEPARTMENT_OTHER): Payer: Self-pay

## 2016-04-26 ENCOUNTER — Emergency Department (HOSPITAL_BASED_OUTPATIENT_CLINIC_OR_DEPARTMENT_OTHER)
Admission: EM | Admit: 2016-04-26 | Discharge: 2016-04-26 | Disposition: A | Discharge status: Home / Self Care | Payer: BLUE CROSS/BLUE SHIELD | Attending: Emergency Medicine | Admitting: Emergency Medicine

## 2016-04-26 ENCOUNTER — Emergency Department (HOSPITAL_BASED_OUTPATIENT_CLINIC_OR_DEPARTMENT_OTHER): Payer: BLUE CROSS/BLUE SHIELD

## 2016-04-26 DIAGNOSIS — Z79899 Other long term (current) drug therapy: Secondary | ICD-10-CM | POA: Diagnosis not present

## 2016-04-26 DIAGNOSIS — G43809 Other migraine, not intractable, without status migrainosus: Secondary | ICD-10-CM | POA: Insufficient documentation

## 2016-04-26 DIAGNOSIS — I10 Essential (primary) hypertension: Secondary | ICD-10-CM | POA: Diagnosis not present

## 2016-04-26 DIAGNOSIS — R51 Headache: Secondary | ICD-10-CM | POA: Diagnosis present

## 2016-04-26 MED ORDER — DIPHENHYDRAMINE HCL 50 MG/ML IJ SOLN
25.0000 mg | Freq: Once | INTRAMUSCULAR | Status: AC
  Administered 2016-04-26: 25 mg via INTRAVENOUS
  Filled 2016-04-26: qty 1

## 2016-04-26 MED ORDER — PROCHLORPERAZINE EDISYLATE 5 MG/ML IJ SOLN
10.0000 mg | Freq: Once | INTRAMUSCULAR | Status: AC
  Administered 2016-04-26: 10 mg via INTRAVENOUS
  Filled 2016-04-26: qty 2

## 2016-04-26 MED ORDER — SODIUM CHLORIDE 0.9 % IV BOLUS (SEPSIS)
1000.0000 mL | Freq: Once | INTRAVENOUS | Status: AC
  Administered 2016-04-26: 1000 mL via INTRAVENOUS

## 2016-04-26 NOTE — ED Notes (Signed)
Patient transported to CT 

## 2016-04-26 NOTE — ED Notes (Addendum)
Woe with HA this am-dizziness since 1130am-NAD-steady gait-later states he has been out of amlodipine x "at least 3 weeks"-took BP at home and it was elevated

## 2016-04-26 NOTE — ED Provider Notes (Signed)
CSN: [id number]     Arrival date & time 04/26/16  1945 History   By signing my name below, I, Jasmyn B. Alexander, attest that this documentation has been prepared under the direction and in the presence of Melene Plan, DO.  Electronically Signed: Gillis Ends. Lyn Hollingshead, ED Scribe. 04/26/2016. 8:16 PM.  Chief Complaint  Patient presents with  . Headache   Patient is a 55 y.o. male presenting with headaches. The history is provided by the patient. No language interpreter was used.  Headache Pain location:  Occipital Quality:  Dull Severity currently:  8/10 Severity at highest:  8/10 Onset quality:  Gradual Duration:  1 week Timing:  Constant Progression:  Unchanged Relieved by:  Nothing Worsened by:  Nothing Ineffective treatments:  None tried Associated symptoms: dizziness and nausea   Associated symptoms: no abdominal pain, no congestion, no diarrhea, no fever, no myalgias, no neck pain, no numbness, no photophobia, no vomiting and no weakness    HPI Comments: Daryl Adkins is a 55 y.o. male with PMHx of Migraines and HTN who presents to the Emergency Department complaining of gradual onset, constant, right side temporal area headache that began when he woke up on 04/26/16. Pt reports current headache is not similar to past migraine headaches because it is located in a different area of the head and it is not as severe. Pt has associated lightheadedness and nausea. There are no modifying factors. Pt reports he has not taken Amlodipine in over 3 weeks because he has ran out of prescription. Denies any photophobia, chest pain, SOB, neck pain, weakness/numbness in legs.  Past Medical History  Diagnosis Date  . Migraine headache   . Hypertension     started meds 2013   . OSA (obstructive sleep apnea)   . Glaucoma   . BPH (benign prostatic hyperplasia)   . Elevated PSA   . Erectile dysfunction    Past Surgical History  Procedure Laterality Date  . Hernia repair      umbilical and B  groin  . Refractive surgery    . Glaucoma surgery  2013    at [facility]  . Prostate biopsy  12-2009    no cancer    Family History  Problem Relation Age of Onset  . Colon cancer Neg Hx   . Prostate cancer Neg Hx   . Diabetes Mother   . CAD Neg Hx   . Stroke Neg Hx   . Hypertension Mother    Social History  Substance Use Topics  . Smoking status: Never Smoker   . Smokeless tobacco: Never Used  . Alcohol Use: No    Review of Systems  Constitutional: Negative for fever and chills.  HENT: Negative for congestion and facial swelling.   Eyes: Negative for photophobia, discharge and visual disturbance.  Respiratory: Negative for shortness of breath.   Cardiovascular: Negative for chest pain and palpitations.  Gastrointestinal: Positive for nausea. Negative for vomiting, abdominal pain and diarrhea.  Musculoskeletal: Negative for myalgias, arthralgias and neck pain.  Skin: Negative for color change and rash.  Neurological: Positive for dizziness and headaches. Negative for tremors, syncope, weakness and numbness.  Psychiatric/Behavioral: Negative for confusion and dysphoric mood.  All other systems reviewed and are negative.   Allergies  Review of patient's allergies indicates no known allergies.  Home Medications   Prior to Admission medications   Medication Sig Start Date End Date Taking? Authorizing Provider  latanoprost (XALATAN) 0.005 % ophthalmic solution Place 1 drop into both  eyes at bedtime.  10/15/13   Historical Provider, MD  losartan (COZAAR) 50 MG tablet Take 1 tablet (50 mg total) by mouth daily. 01/17/16   Wanda PlumpJose E Paz, MD  tadalafil (CIALIS) 5 MG tablet Take 5 mg by mouth daily.    Historical Provider, MD   BP 144/84 mmHg  Pulse 61  Temp(Src) 98.3 F (36.8 C) (Oral)  Resp 10  Ht 5\' 8"  (1.727 m)  Wt 196 lb (88.905 kg)  BMI 29.81 kg/m2  SpO2 98% Physical Exam  Constitutional: He is oriented to person, place, and time. He appears well-developed and  well-nourished.  HENT:  Head: Normocephalic and atraumatic.  Eyes: EOM are normal. Pupils are equal, round, and reactive to light.  Neck: Normal range of motion. Neck supple. No JVD present.  Cardiovascular: Normal rate and regular rhythm.  Exam reveals no gallop and no friction rub.   No murmur heard. Pulmonary/Chest: No respiratory distress. He has no wheezes.  Abdominal: He exhibits no distension. There is no rebound and no guarding.  Musculoskeletal: Normal range of motion.  Neurological: He is alert and oriented to person, place, and time. No cranial nerve deficit or sensory deficit. Coordination normal. GCS eye subscore is 4. GCS verbal subscore is 5. GCS motor subscore is 6. He displays no Babinski's sign on the right side.  Reflex Scores:      Tricep reflexes are 2+ on the right side and 2+ on the left side.      Bicep reflexes are 2+ on the right side and 2+ on the left side.      Brachioradialis reflexes are 2+ on the right side and 2+ on the left side.      Patellar reflexes are 2+ on the right side and 2+ on the left side.      Achilles reflexes are 2+ on the right side and 2+ on the left side. Skin: No rash noted. No pallor.  Psychiatric: He has a normal mood and affect. His behavior is normal.  Nursing note and vitals reviewed.   ED Course  Procedures (including critical care time) DIAGNOSTIC STUDIES: Oxygen Saturation is 98% on RA, normal by my interpretation.    COORDINATION OF CARE: 8:16 PM-Discussed treatment plan which includes Head CT and EKG with pt at bedside and pt agreed to plan. Will order Compazine and Benadryl.  Imaging Review Ct Head Wo Contrast  04/26/2016  CLINICAL DATA:  55 year old male with headache EXAM: CT HEAD WITHOUT CONTRAST TECHNIQUE: Contiguous axial images were obtained from the base of the skull through the vertex without intravenous contrast. COMPARISON:  None. FINDINGS: The ventricles and the sulci are appropriate in size for the patient's  age. There is no intracranial hemorrhage. No midline shift or mass effect identified. The gray-white matter differentiation is preserved. The visualized paranasal sinuses and mastoid air cells are well aerated. The calvarium is intact. IMPRESSION: No acute intracranial pathology. Electronically Signed   By: Elgie CollardArash  Radparvar M.D.   On: 04/26/2016 21:32   I have personally reviewed and evaluated these images and lab results as part of my medical decision-making.   MDM   Final diagnoses:  Other migraine without status migrainosus, not intractable    55 yo M with a cc of headache. Atypical of his normal headaches. CT the head is normal. Benign neurologic exam. Symptoms significantly improved with a headache cocktail. Discharge home.  I personally performed the services described in this documentation, which was scribed in my presence. The recorded information  has been reviewed and is accurate.   10:31 PM:  I have discussed the diagnosis/risks/treatment options with the patient and family and believe the pt to be eligible for discharge home to follow-up with PCP. We also discussed returning to the ED immediately if new or worsening sx occur. We discussed the sx which are most concerning (e.g., sudden worsening pain, fever, inability to tolerate by mouth) that necessitate immediate return. Medications administered to the patient during their visit and any new prescriptions provided to the patient are listed below.  Medications given during this visit Medications  prochlorperazine (COMPAZINE) injection 10 mg (10 mg Intravenous Given 04/26/16 2045)  diphenhydrAMINE (BENADRYL) injection 25 mg (25 mg Intravenous Given 04/26/16 2045)  sodium chloride 0.9 % bolus 1,000 mL (0 mLs Intravenous Stopped 04/26/16 2222)    Discharge Medication List as of 04/26/2016 10:12 PM      The patient appears reasonably screen and/or stabilized for discharge and I doubt any other medical condition or other [facility] requiring  further screening, evaluation, or treatment in the ED at this time prior to discharge.    Melene Planan Netra Postlethwait, DO 04/26/16 2232

## 2016-04-26 NOTE — ED Notes (Signed)
Pt placed on auto vitals Q30. Patient placed on cardiac monitor.  

## 2016-04-26 NOTE — ED Notes (Addendum)
Patient reports that he has a History of Migraines, this headache is not a Migraine per the patient. It is in a different area. Patient sitting in room with lights on, denies any sensitivity to the lights. Patient denies any Dizziness now but reports that at home about 6 30 he had some dizziness. The patient denies any chest pain

## 2016-04-26 NOTE — Discharge Instructions (Signed)

## 2016-05-15 ENCOUNTER — Encounter: Payer: Self-pay | Admitting: Medical

## 2016-05-15 ENCOUNTER — Ambulatory Visit (INDEPENDENT_AMBULATORY_CARE_PROVIDER_SITE_OTHER): Payer: BLUE CROSS/BLUE SHIELD | Admitting: Medical

## 2016-05-15 ENCOUNTER — Telehealth: Payer: Self-pay

## 2016-05-15 VITALS — BP 116/70 | HR 56 | Temp 98.3°F | Ht 68.0 in | Wt 194.8 lb

## 2016-05-15 DIAGNOSIS — G43809 Other migraine, not intractable, without status migrainosus: Secondary | ICD-10-CM | POA: Diagnosis not present

## 2016-05-15 MED ORDER — DICLOFENAC SODIUM 75 MG PO TBEC: 75.0000 mg | DELAYED_RELEASE_TABLET | Freq: Two times a day (BID) | ORAL | Status: DC

## 2016-05-15 MED ORDER — BUTALBITAL-ASPIRIN-CAFFEINE 50-325-40 MG PO CAPS: ORAL_CAPSULE | ORAL | Status: DC

## 2016-05-15 MED ORDER — KETOROLAC TROMETHAMINE 60 MG/2ML IM SOLN
60.0000 mg | Freq: Once | INTRAMUSCULAR | Status: AC
  Administered 2016-05-15: 60 mg via INTRAMUSCULAR

## 2016-05-15 MED ORDER — SUMATRIPTAN SUCCINATE 50 MG PO TABS: 50.0000 mg | ORAL_TABLET | ORAL | Status: DC | PRN

## 2016-05-15 NOTE — Addendum Note (Signed)
Addended by: Gwenevere AbbotSAGUIER, Ragan Reale M on: 05/15/2016 11:16 AM   Modules accepted: Orders

## 2016-05-15 NOTE — Telephone Encounter (Signed)
Spoke with Shawn at the pharmacy and he stated that the imitrex may have needed a prior authorization due to the number in the system. He states that the insurance will only pay for 9 tablets and the order was changed at the pharmacy.   Also spoke with pt and advised him that the imitrex would be at the pharmacy and they would only be able to fill 9 tablets due to the patients insurance. Pt voices understanding.

## 2016-05-15 NOTE — Patient Instructions (Addendum)
For your migraine HA we gave toradol 60 mg im. If you have recurrent migraine type then I am prescribing imitrex. Fill this before you leave for vacation.  If your HA does not resolve with toradol then recommend ED evaluation.  While on vacation if ha severe and not responding to imitrex then be seen by UC or ED out of town.  If you are not satisfied with imitrex treatment then notify us when back from vacation and could refer to neurologist.  Follow up in 1-2 weeks or as needed(important to follow up as in some cases MRI may be needed if not getting adequate HA pain relief)

## 2016-05-15 NOTE — Progress Notes (Signed)
Pre visit review using our clinic review tool, if applicable. No additional management support is needed unless otherwise documented below in the visit note. 

## 2016-05-15 NOTE — Progress Notes (Addendum)
Subjective:    Patient ID: Daryl FeyDarryl W Adkins, male    DOB: 22-Jun-1961, 55 y.o.   MRN: [id number]  HPI  Pt in with HA for 2 days. HA light and sound sensitive. Some smell sensitive.  Hx of migraine HA in the past. He had a break from HA from  2013 up until the present. Pt states on 04-26-2016 went to ED for HA.Pt had CT of his head at that time and was normal. Pt states treatment in ED did bring his ha down. But then reoccured 2 days ago  Pt bp good today.  No gross motor or sensory function deficits. HA level presently 5/10.  Pt in past was treximet. It made him very sleepy in the past but seemed to help HA.  Pt is about to go out of town for vacation. Going to the beach.     Review of Systems  Constitutional: Negative for fever, chills and fatigue.  Respiratory: Negative for cough, chest tightness and wheezing.   Cardiovascular: Negative for chest pain and palpitations.  Musculoskeletal: Negative for back pain.  Neurological: Positive for headaches. Negative for dizziness, tremors, syncope, facial asymmetry, speech difficulty, weakness and numbness.  Hematological: Negative for adenopathy. Does not bruise/bleed easily.  Psychiatric/Behavioral: Negative for behavioral problems, sleep disturbance and dysphoric mood. The patient is not nervous/anxious.    Past Medical History  Diagnosis Date  . Migraine headache   . Hypertension     started meds 2013   . OSA (obstructive sleep apnea)   . Glaucoma   . BPH (benign prostatic hyperplasia)   . Elevated PSA   . Erectile dysfunction      Social History   Social History  . Marital Status: Married    Spouse Name: N/A  . Number of Children: 5  . Years of Education: N/A   Occupational History  . [employer]    Social History Main Topics  . Smoking status: Never Smoker   . Smokeless tobacco: Never Used  . Alcohol Use: No  . Drug Use: No  . Sexual Activity: Not on file   Other Topics Concern  . Not on file   Social History  Narrative   Lives w/ wife    Past Surgical History  Procedure Laterality Date  . Hernia repair      umbilical and B groin  . Refractive surgery    . [facility]  . Prostate biopsy  12-2009    no cancer     Family History  Problem Relation Age of Onset  . Colon cancer Neg Hx   . Prostate cancer Neg Hx   . Diabetes Mother   . CAD Neg Hx   . Stroke Neg Hx   . Hypertension Mother     No Known Allergies  Current Outpatient Prescriptions on File Prior to Visit  Medication Sig Dispense Refill  . latanoprost (XALATAN) 0.005 % ophthalmic solution Place 1 drop into both eyes at bedtime.     Marland Kitchen. losartan (COZAAR) 50 MG tablet Take 1 tablet (50 mg total) by mouth daily. 90 tablet 2  . tadalafil (CIALIS) 5 MG tablet Take 5 mg by mouth daily.     No current facility-administered medications on file prior to visit.    BP 116/70 mmHg  Pulse 56  Temp(Src) 98.3 F (36.8 C) (Oral)  Ht 5\' 8"  (1.727 m)  Wt 194 lb 12.8 oz (88.361 kg)  BMI 29.63 kg/m2  SpO2 99%  Objective:   Physical Exam   General Mental Status- Alert. General Appearance- Not in acute distress.   Skin General: Color- Normal Color. Moisture- Normal Moisture.  Neck Carotid Arteries- Normal color. Moisture- Normal Moisture. No carotid bruits. No JVD.  Chest and Lung Exam Auscultation: Breath Sounds:-Normal.  Cardiovascular Auscultation:Rythm- Regular. Murmurs & Other Heart Sounds:Auscultation of the heart reveals- No Murmurs.    Neurologic Cranial Nerve exam:- CN III-XII intact(No nystagmus), symmetric smile. Drift Test:- No drift. Romberg Exam:- Negative.  Heal to Toe Gait exam:-Normal. Finger to Nose:- Normal/Intact Strength:- 5/5 equal and symmetric strength both upper and lower extremities.     Assessment & Plan:  For your migraine HA we gave toradol 60 mg im. If you have recurrent migraine type then I am prescribing imitrex. Fill this before you leave for  vacation.  If your HA does not resolve with toradol then recommend ED evaluation.  While on vacation if ha severe and not responding to imitrex then be seen by UC or ED out of town.  If you are not satisfied with imitrex treatment then notify us when back from vacation and could refer to neurologist.  Follow up in 1-2 weeks or as needed(important to follow up as in some cases MRI may be needed if not getting adequate HA pain relief)  Instructed wife on max 2 tabs of imitrex within any 24 hour period.   Pt needs prior authorization on imitrex. They are trying to leave for beach by 12 afternoon. So I am going to rx diclofenac and fiornal  to use for ha if has on vacation.(not my first choice but imitrex needs prior auth). This is plan provided MA can't get imitrex pushed through. If she can then will cancel fiorinal rx.

## 2016-06-04 ENCOUNTER — Ambulatory Visit: Payer: Self-pay | Admitting: Pulmonary Disease

## 2016-07-19 ENCOUNTER — Ambulatory Visit: Payer: BLUE CROSS/BLUE SHIELD | Admitting: Internal Medicine

## 2016-08-27 ENCOUNTER — Ambulatory Visit (INDEPENDENT_AMBULATORY_CARE_PROVIDER_SITE_OTHER): Payer: BLUE CROSS/BLUE SHIELD | Admitting: Pulmonary Disease

## 2016-08-27 ENCOUNTER — Encounter: Payer: Self-pay | Admitting: Pulmonary Disease

## 2016-08-27 VITALS — BP 120/82 | HR 44 | Ht 68.0 in | Wt 196.4 lb

## 2016-08-27 DIAGNOSIS — Z9989 Dependence on other enabling machines and devices: Secondary | ICD-10-CM | POA: Diagnosis not present

## 2016-08-27 DIAGNOSIS — G4733 Obstructive sleep apnea (adult) (pediatric): Secondary | ICD-10-CM

## 2016-08-27 NOTE — Progress Notes (Signed)
Current Outpatient Prescriptions on File Prior to Visit  Medication Sig  . butalbital-aspirin-caffeine (FIORINAL) 50-325-40 MG capsule 1-2 tab po q 6 hrs as needed ha  . latanoprost (XALATAN) 0.005 % ophthalmic solution Place 1 drop into both eyes at bedtime.   Marland Kitchen. losartan (COZAAR) 50 MG tablet Take 1 tablet (50 mg total) by mouth daily.  . SUMAtriptan (IMITREX) 50 MG tablet Take 1 tablet (50 mg total) by mouth every 2 (two) hours as needed for migraine. May repeat in 2 hours if headache persists or recurs.  . tadalafil (CIALIS) 5 MG tablet Take 5 mg by mouth daily.   No current facility-administered medications on file prior to visit.      Chief Complaint  Patient presents with  . Follow-up    Wears CPAP nightly. Denies problems with mask/pressure. DME: [facility]     Sleep tests PSG ([facility]) 09/13/07 >> AHI 9, SpO2 low 80% CPAP 07/25/16 to 08/27/16 >> used on 30 of 34 nights with average 6 hrs 9 min.  Average AHI 1.3 with CPAP 9 cm H2O  Past medical history BPH, ED, Glaucoma, HTN, HA  Past surgical history, Family history, Social history, Allergies reviewed  Vital Signs BP 120/82 (BP Location: Left Arm, Cuff Size: Normal)   Pulse (!) 44   Ht 5\' 8"  (1.727 m)   Wt 196 lb 6.4 oz (89.1 kg)   SpO2 98%   BMI 29.86 kg/m   History of Present Illness Destan W Gregery NaOverdiep is a 55 y.o. male with obstructive sleep apnea.  I last saw him in 2015.  He uses CPAP nightly.  He has nasal pillows.  He needs new machine and supplies.  Physical Exam  General - No distress ENT - No sinus tenderness, no oral exudate, no LAN, MP 4, enlarged tongue Cardiac - s1s2 regular, no murmur Chest - No wheeze/rales/dullness Back - No focal tenderness Abd - Soft, non-tender Ext - No edema Neuro - Normal strength Skin - No rashes Psych - normal mood, and behavior   Assessment/Plan  Obstructive sleep apnea. - he is compliant with CPAP and reports benefit from therapy - continue CPAP 9 cm  H2O - will arrange for new machine and supplies > advised that he might need repeat sleep study and earlier follow up if these are required by his insurance provider   Patient Instructions  Will arrange for new CPAP machine and supplies  Follow up in 1 year    Coralyn HellingVineet Norva Bowe, MD [facility] Pager:  [phone] 08/27/2016, 4:12 PM

## 2016-08-27 NOTE — Patient Instructions (Signed)
Will arrange for new CPAP machine and supplies  Follow up in 1 year 

## 2017-01-09 ENCOUNTER — Other Ambulatory Visit: Payer: Self-pay | Admitting: Urology

## 2017-01-30 ENCOUNTER — Encounter (HOSPITAL_BASED_OUTPATIENT_CLINIC_OR_DEPARTMENT_OTHER): Payer: Self-pay | Admitting: *Deleted

## 2017-01-31 ENCOUNTER — Encounter (HOSPITAL_BASED_OUTPATIENT_CLINIC_OR_DEPARTMENT_OTHER): Payer: Self-pay | Admitting: *Deleted

## 2017-01-31 NOTE — Progress Notes (Signed)
NPO AFTER MN.  ARRIVE AT 0915.  NEEDS ISTAT AND EKG. WILL TAKE LOSARTAN AM DOS W/ SIPS OF WATER. 

## 2017-02-10 NOTE — H&P (Signed)
Office Visit Report     01/02/2017   --------------------------------------------------------------------------------   Daryl Adkins. Daryl Adkins  MRN: [id number]  PRIMARY CARE:  Willow Ora, MD  DOB: 1961-05-09, 56 year old Male  REFERRING:  Willow Ora, MD   PROVIDER:  Jethro Bolus, M.D.    LOCATION:  [facility] [phone]   --------------------------------------------------------------------------------   CC: BPH  HPI: Daryl Adkins is a 56 year-old male established patient who is here for follow up regarding further evaluation of BPH and lower urinary tract symptoms.  The patient complains of lower urinary tract symptom(s) that include urgency, weak stream, and nocturia. The patient states his most bothersome symptom(s) are the following: urgency. Patient is currently treated with no rx for his symptoms. His symptoms have been worse over the last year. The patient states if he were to spend the rest of his life with his current urinary condition, he would be unhappy. He complains of other associated symptom(s). He has previously tried Cialis 5 mg, Rapaflo 8 mg for his symptoms.   56 year old male, with history of BPH, failing Vesicare and Rapaflo, and failing finasteride therapy as well. The patient is status post prostate biopsy in 2011 showing BPH only. He has a history of elevated PSA of 10.13 with free fraction of 13%. (Off finasteride).   The patient has had video urodynamics, showing a bladder capacity of 450 cc, with a maximum flow rate of 8 cc/s, and a maximum detrusor pressure of 57 cm of water. He is felt to have a high-pressure, low-flow. We have discussed pursuing Urolift in the past, but it has not been covered by his insurance company. We have also considered thulium laser bladder neck incision.     AUA Symptom Score: 50% of the time he has the sensation of not emptying his bladder completely when finished urinating. He never has to urinate again less that two  hours after he has finished urinating. Less than 20% of the time he has to start and stop again several times when he urinates. More than 50% of the time he finds it difficult to postpone urination. Less than 50% of the time he has a weak urinary stream. Less than 50% of the time he has to push or strain to begin urination. He has to get up to urinate 1 time from the time he goes to bed until the time he gets up in the morning.   Calculated AUA Symptom Score: 13    ALLERGIES: No Allergies    MEDICATIONS: Aspirin 81 MG TABS Oral  Azopt 1 % suspension, drops  Latanoprost 0.005 % drops Ophthalmic  Losartan Potassium 50 mg tablet Oral     GU PSH: Rpr Umbil Hern; Reduc < 5 Yr - 2011      PSH Notes: Eye Surgery, Inguinal Hernia Repair, Umbilical Hernia Repair   NON-GU PSH: None   GU PMH: BPH w/LUTS, Benign localized hyperplasia of prostate with urinary obstruction - 12/07/2015, Benign localized prostatic hyperplasia with lower urinary tract symptoms (LUTS), - 05/17/2015 Urinary Frequency, Increased urinary frequency - 12/07/2015 Urinary Urgency, Urinary urgency - 12/07/2015 Elevated PSA, Elevated prostate specific antigen (PSA) - 05/17/2015 Male ED, unspecified, Erectile dysfunction - 09/14/2014      PMH Notes:  2013-05-20 11:46:44 - Note: Unable To Ejaculate (But Not Impotent)   NON-GU PMH: Encounter for general adult medical examination without abnormal findings, Encounter for preventive health examination - 11/25/2015 Anxiety, Anxiety (Symptom) - 2014 Personal history of other diseases of the circulatory  system, History of hypertension - 2014 Personal history of other diseases of the nervous system and sense organs, History of sleep apnea - 2014, History of migraine headaches, - 2014, History of glaucoma, - 2014 Personal history of other mental and behavioral disorders, History of depression - 2014    FAMILY HISTORY: Diabetes - Runs In Family Family Health Status Number - Runs In Family    SOCIAL HISTORY: Marital Status: Married Current Smoking Status: Patient has never smoked.   Tobacco Use Assessment Completed: Used Tobacco in last 30 days? Drinks 2 drinks per week.  Drinks 2 caffeinated drinks per day. 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     Notes: Never A Smoker, Alcohol Use, Tobacco Use, Occupation:, Caffeine Use, Marital History - Currently Married   REVIEW OF SYSTEMS:    GU Review Male:   Patient reports hard to postpone urination. Patient denies frequent urination, burning/ pain with urination, get up at night to urinate, leakage of urine, stream starts and stops, trouble starting your stream, have to strain to urinate , erection problems, and penile pain.  Gastrointestinal (Upper):   Patient denies vomiting, indigestion/ heartburn, and nausea.  Gastrointestinal (Lower):   Patient denies diarrhea and constipation.  Constitutional:   Patient denies fever, night sweats, weight loss, and fatigue.  Skin:   Patient denies skin rash/ lesion and itching.  Eyes:   Patient denies blurred vision and double vision.  Ears/ Nose/ Throat:   Patient denies sore throat and sinus problems.  Hematologic/Lymphatic:   Patient denies swollen glands and easy bruising.  Cardiovascular:   Patient denies leg swelling and chest pains.  Respiratory:   Patient denies cough and shortness of breath.  Endocrine:   Patient denies excessive thirst.  Musculoskeletal:   Patient denies back pain and joint pain.  Neurological:   Patient denies headaches and dizziness.  Psychologic:   Patient denies depression and anxiety.   VITAL SIGNS:      01/02/2017 11:28 AM  Weight 200 lb / 90.72 kg  Height 68 in / 172.72 cm  BP 154/89 mmHg  Pulse 64 /min  Temperature 97.7 F / 36 C  BMI 30.4 kg/m   GU PHYSICAL EXAMINATION:    Anus and Perineum: No hemorrhoids. No anal stenosis. No rectal fissure, no anal fissure. No edema, no dimple, no perineal tenderness, no anal tenderness.  Scrotum: No lesions.  No edema. No cysts. No warts.  Epididymides: Right: no spermatocele, no masses, no cysts, no tenderness, no induration, no enlargement. Left: no spermatocele, no masses, no cysts, no tenderness, no induration, no enlargement.  Testes: No tenderness, no swelling, no enlargement left testes. No tenderness, no swelling, no enlargement right testes. Normal location left testes. Normal location right testes. No mass, no cyst, no varicocele, no hydrocele left testes. No mass, no cyst, no varicocele, no hydrocele right testes.  Urethral Meatus: Normal size. No lesion, no wart, no discharge, no polyp. Normal location.  Penis: Circumcised, no warts, no cracks. No dorsal Peyronie's plaques, no left corporal Peyronie's plaques, no right corporal Peyronie's plaques, no scarring, no warts. No balanitis, no meatal stenosis.  Prostate: Prostate 4 + size. Left lobe normal consistency, right lobe normal consistency. Symmetrical lobes. No prostate nodule. Left lobe no tenderness, right lobe no tenderness.   Seminal Vesicles: Nonpalpable.  Sphincter Tone: Normal sphincter. No rectal tenderness. No rectal mass.    MULTI-SYSTEM PHYSICAL EXAMINATION:    Constitutional: Well-nourished. No physical deformities. Normally developed. Good grooming.  Neck: Neck symmetrical, not swollen. Normal tracheal  position.  Respiratory: No labored breathing, no use of accessory muscles.   Cardiovascular: Normal temperature, normal extremity pulses, no swelling, no varicosities.  Lymphatic: No enlargement of neck, axillae, groin.  Skin: No paleness, no jaundice, no cyanosis. No lesion, no ulcer, no rash.  Neurologic / Psychiatric: Oriented to time, oriented to place, oriented to person. No depression, no anxiety, no agitation.  Gastrointestinal: No mass, no tenderness, no rigidity, non obese abdomen.  Eyes: Normal conjunctivae. Normal eyelids.  Ears, Nose, Mouth, and Throat: Left ear no scars, no lesions, no masses. Right ear no scars,  no lesions, no masses. Nose no scars, no lesions, no masses. Normal hearing. Normal lips.  Musculoskeletal: Normal gait and station of head and neck.     PAST DATA REVIEWED:  Source Of History:  Patient  Records Review:   AUA Symptom Score   05/12/15 09/08/14 08/02/14 11/07/13 12/31/12 11/23/09  PSA  Total PSA 12.77  8.40  6.95  6.73  10.13  13.30   Free PSA  1.34   1.14  1.29  2.33   % Free PSA  17.5     04/07/13  Hormones  Testosterone, Total 265     01/02/17 12/07/15 11/25/15 05/17/15 09/14/14 08/10/14 11/13/13 05/20/13  Urinalysis  Urine Appearance Clear          Urine Color Yellow          Urine Glucose Neg          Urine Bilirubin Neg          Urine Ketones Neg          Urine Specific Gravity 1.025          Urine Blood 1+  TRACE  1+  TRACE  SMALL  TRACE  TRACE  SMALL   Urine pH 5.5  6.0  6.0  5.5  5.0  5.5  6.0  5.5   Urine Protein Neg  NEGATIVE  NEGATIVE  NEG  NEG  NEG  NEG  NEG   Urine Urobilinogen 0.2          Urine Nitrites Neg          Urine Leukocyte Esterase Neg          Urine WBC/hpf NS (Not Seen)          Urine RBC/hpf 0 - 2/hpf          Urine Epithelial Cells NS (Not Seen)          Urine Bacteria NS (Not Seen)          Urine Mucous Not Present          Urine Yeast NS (Not Seen)          Urine Trichomonas Not Present          Urine Cystals NS (Not Seen)          Urine Casts NS (Not Seen)          Urine Sperm Not Present           PROCEDURES:          Urinalysis w/Scope - 81001 Dipstick Dipstick Cont'd Micro  Color: Yellow Bilirubin: Neg WBC/hpf: NS (Not Seen)  Appearance: Clear Ketones: Neg RBC/hpf: 0 - 2/hpf  Specific Gravity: 1.025 Blood: 1+ Bacteria: NS (Not Seen)  pH: 5.5 Protein: Neg Cystals: NS (Not Seen)  Glucose: Neg Urobilinogen: 0.2 Casts: NS (Not Seen)    Nitrites: Neg Trichomonas: Not  Present    Leukocyte Esterase: Neg Mucous: Not Present      Epithelial Cells: NS (Not Seen)      Yeast: NS (Not Seen)      Sperm: Not  Present    Notes:      ASSESSMENT:      ICD-10 Details  1 GU:   BPH w/LUTS - N40.1   2   Urinary Frequency - R35.0   3   Urinary Urgency - R39.15   4   Male ED, unspecified - N52.9           Notes:   Cystoscopy in the past has shown enlarged lateral lobes, but no median lobe. He has mild grade 1 trabeculation within the bladder. His prostate volume is 56 g. He would be a reasonable candidate for thulium laser bladder neck incision. Incision will stop 1 cm proximal to the veru, in an effort to preserve antegrade ejaculaion in this 56 yo male.    PLAN:           Document Letter(s):  Created for Patient: Clinical Summary         Notes:   Thulium laser prostate.      Signed by Jethro Bolus, M.D. on 01/08/17 at 8:26 AM (EST)     The information contained in this medical record document is considered private and confidential patient information. This information can only be used for the medical diagnosis and/or medical services that are being provided by the patient's selected caregivers. This information can only be distributed outside of the patient's care if the patient agrees and signs waivers of authorization for this information to be sent to an outside source or route.

## 2017-02-11 ENCOUNTER — Ambulatory Visit (HOSPITAL_BASED_OUTPATIENT_CLINIC_OR_DEPARTMENT_OTHER): Payer: BLUE CROSS/BLUE SHIELD | Admitting: Anesthesiology

## 2017-02-11 ENCOUNTER — Encounter (HOSPITAL_BASED_OUTPATIENT_CLINIC_OR_DEPARTMENT_OTHER)
Admission: RE | Disposition: A | Discharge status: Home / Self Care | Payer: Self-pay | Source: Ambulatory Visit | Attending: Urology

## 2017-02-11 ENCOUNTER — Ambulatory Visit (HOSPITAL_BASED_OUTPATIENT_CLINIC_OR_DEPARTMENT_OTHER)
Admission: RE | Admit: 2017-02-11 | Discharge: 2017-02-11 | Disposition: A | Discharge status: Home / Self Care | Payer: BLUE CROSS/BLUE SHIELD | Source: Ambulatory Visit | Attending: Urology | Admitting: Urology

## 2017-02-11 ENCOUNTER — Encounter (HOSPITAL_BASED_OUTPATIENT_CLINIC_OR_DEPARTMENT_OTHER): Payer: Self-pay

## 2017-02-11 DIAGNOSIS — F419 Anxiety disorder, unspecified: Secondary | ICD-10-CM | POA: Insufficient documentation

## 2017-02-11 DIAGNOSIS — G4730 Sleep apnea, unspecified: Secondary | ICD-10-CM | POA: Diagnosis not present

## 2017-02-11 DIAGNOSIS — N138 Other obstructive and reflux uropathy: Secondary | ICD-10-CM

## 2017-02-11 DIAGNOSIS — N401 Enlarged prostate with lower urinary tract symptoms: Secondary | ICD-10-CM | POA: Insufficient documentation

## 2017-02-11 DIAGNOSIS — R351 Nocturia: Secondary | ICD-10-CM | POA: Insufficient documentation

## 2017-02-11 DIAGNOSIS — R3912 Poor urinary stream: Secondary | ICD-10-CM | POA: Diagnosis not present

## 2017-02-11 DIAGNOSIS — Z7982 Long term (current) use of aspirin: Secondary | ICD-10-CM | POA: Insufficient documentation

## 2017-02-11 DIAGNOSIS — R3915 Urgency of urination: Secondary | ICD-10-CM | POA: Insufficient documentation

## 2017-02-11 DIAGNOSIS — I10 Essential (primary) hypertension: Secondary | ICD-10-CM | POA: Diagnosis not present

## 2017-02-11 HISTORY — DX: Dependence on other enabling machines and devices: Z99.89

## 2017-02-11 HISTORY — DX: Benign prostatic hyperplasia with lower urinary tract symptoms: N40.1

## 2017-02-11 HISTORY — DX: Obstructive sleep apnea (adult) (pediatric): G47.33

## 2017-02-11 HISTORY — DX: Adverse effect of unspecified anesthetic, initial encounter: T41.45XA

## 2017-02-11 HISTORY — DX: Feeling of incomplete bladder emptying: R39.14

## 2017-02-11 HISTORY — DX: Other complications of anesthesia, initial encounter: T88.59XA

## 2017-02-11 HISTORY — DX: Presence of spectacles and contact lenses: Z97.3

## 2017-02-11 HISTORY — PX: THULIUM LASER TURP (TRANSURETHRAL RESECTION OF PROSTATE): SHX6744

## 2017-02-11 HISTORY — DX: Unspecified open-angle glaucoma, stage unspecified: H40.10X0

## 2017-02-11 LAB — POCT I-STAT 4, (NA,K, GLUC, HGB,HCT)
GLUCOSE: 86 mg/dL (ref 65–99)
HEMATOCRIT: 45 % (ref 39.0–52.0)
HEMOGLOBIN: 15.3 g/dL (ref 13.0–17.0)
POTASSIUM: 3.9 mmol/L (ref 3.5–5.1)
Sodium: 144 mmol/L (ref 135–145)

## 2017-02-11 SURGERY — THULIUM LASER TURP (TRANSURETHRAL RESECTION OF PROSTATE)
Anesthesia: General | Site: Prostate

## 2017-02-11 MED ORDER — FENTANYL CITRATE (PF) 100 MCG/2ML IJ SOLN
25.0000 ug | INTRAMUSCULAR | Status: DC | PRN
  Administered 2017-02-11 (×2): 25 ug via INTRAVENOUS
  Filled 2017-02-11: qty 1

## 2017-02-11 MED ORDER — BELLADONNA ALKALOIDS-OPIUM 16.2-60 MG RE SUPP
RECTAL | Status: AC
  Filled 2017-02-11: qty 1

## 2017-02-11 MED ORDER — BACITRACIN-NEOMYCIN-POLYMYXIN 400-5-5000 EX OINT: 1.0000 "application " | TOPICAL_OINTMENT | Freq: Two times a day (BID) | CUTANEOUS | 0 refills | Status: DC

## 2017-02-11 MED ORDER — ONDANSETRON HCL 4 MG/2ML IJ SOLN
INTRAMUSCULAR | Status: AC
  Filled 2017-02-11: qty 2

## 2017-02-11 MED ORDER — LIDOCAINE 2% (20 MG/ML) 5 ML SYRINGE
INTRAMUSCULAR | Status: DC | PRN
  Administered 2017-02-11: 100 mg via INTRAVENOUS

## 2017-02-11 MED ORDER — BELLADONNA ALKALOIDS-OPIUM 16.2-60 MG RE SUPP
RECTAL | Status: DC | PRN
  Administered 2017-02-11: 1 via RECTAL

## 2017-02-11 MED ORDER — LIDOCAINE 2% (20 MG/ML) 5 ML SYRINGE
INTRAMUSCULAR | Status: AC
  Filled 2017-02-11: qty 5

## 2017-02-11 MED ORDER — KETOROLAC TROMETHAMINE 30 MG/ML IJ SOLN
INTRAMUSCULAR | Status: DC | PRN
  Administered 2017-02-11: 15 mg via INTRAVENOUS

## 2017-02-11 MED ORDER — CEFAZOLIN SODIUM-DEXTROSE 2-4 GM/100ML-% IV SOLN
INTRAVENOUS | Status: AC
  Filled 2017-02-11: qty 100

## 2017-02-11 MED ORDER — ACETAMINOPHEN 500 MG PO TABS
ORAL_TABLET | ORAL | Status: AC
  Filled 2017-02-11: qty 2

## 2017-02-11 MED ORDER — SODIUM CHLORIDE 0.9 % IR SOLN
Status: DC | PRN
  Administered 2017-02-11: 9000 mL

## 2017-02-11 MED ORDER — ACETAMINOPHEN 500 MG PO TABS
1000.0000 mg | ORAL_TABLET | ORAL | Status: AC
  Administered 2017-02-11: 1000 mg via ORAL
  Filled 2017-02-11: qty 2

## 2017-02-11 MED ORDER — KETOROLAC TROMETHAMINE 30 MG/ML IJ SOLN
INTRAMUSCULAR | Status: AC
  Filled 2017-02-11: qty 1

## 2017-02-11 MED ORDER — DEXAMETHASONE SODIUM PHOSPHATE 10 MG/ML IJ SOLN
INTRAMUSCULAR | Status: DC | PRN
  Administered 2017-02-11: 10 mg via INTRAVENOUS

## 2017-02-11 MED ORDER — OXYCODONE HCL 5 MG PO TABS
5.0000 mg | ORAL_TABLET | Freq: Once | ORAL | Status: DC | PRN
  Filled 2017-02-11: qty 1

## 2017-02-11 MED ORDER — MEPERIDINE HCL 25 MG/ML IJ SOLN
6.2500 mg | INTRAMUSCULAR | Status: DC | PRN
  Filled 2017-02-11: qty 1

## 2017-02-11 MED ORDER — DEXAMETHASONE SODIUM PHOSPHATE 10 MG/ML IJ SOLN
INTRAMUSCULAR | Status: AC
  Filled 2017-02-11: qty 1

## 2017-02-11 MED ORDER — ACETAMINOPHEN 325 MG PO TABS
325.0000 mg | ORAL_TABLET | ORAL | Status: DC | PRN
  Filled 2017-02-11: qty 2

## 2017-02-11 MED ORDER — PROPOFOL 10 MG/ML IV BOLUS
INTRAVENOUS | Status: DC | PRN
  Administered 2017-02-11: 200 mg via INTRAVENOUS

## 2017-02-11 MED ORDER — METOCLOPRAMIDE HCL 5 MG/ML IJ SOLN
INTRAMUSCULAR | Status: AC
  Filled 2017-02-11: qty 2

## 2017-02-11 MED ORDER — CEFAZOLIN SODIUM-DEXTROSE 2-4 GM/100ML-% IV SOLN
2.0000 g | INTRAVENOUS | Status: AC
  Administered 2017-02-11: 2 g via INTRAVENOUS
  Filled 2017-02-11: qty 100

## 2017-02-11 MED ORDER — ONDANSETRON HCL 4 MG/2ML IJ SOLN
4.0000 mg | Freq: Once | INTRAMUSCULAR | Status: DC | PRN
  Filled 2017-02-11: qty 2

## 2017-02-11 MED ORDER — METOCLOPRAMIDE HCL 5 MG/ML IJ SOLN
INTRAMUSCULAR | Status: DC | PRN
  Administered 2017-02-11: 10 mg via INTRAVENOUS

## 2017-02-11 MED ORDER — CEFAZOLIN IN D5W 1 GM/50ML IV SOLN
1.0000 g | INTRAVENOUS | Status: DC
  Filled 2017-02-11: qty 50

## 2017-02-11 MED ORDER — ACETAMINOPHEN 160 MG/5ML PO SOLN
325.0000 mg | ORAL | Status: DC | PRN
  Filled 2017-02-11: qty 20.3

## 2017-02-11 MED ORDER — ONDANSETRON HCL 4 MG/2ML IJ SOLN
INTRAMUSCULAR | Status: DC | PRN
  Administered 2017-02-11: 4 mg via INTRAVENOUS

## 2017-02-11 MED ORDER — FENTANYL CITRATE (PF) 100 MCG/2ML IJ SOLN
INTRAMUSCULAR | Status: AC
  Filled 2017-02-11: qty 2

## 2017-02-11 MED ORDER — OXYCODONE HCL 5 MG/5ML PO SOLN
5.0000 mg | Freq: Once | ORAL | Status: DC | PRN
  Filled 2017-02-11: qty 5

## 2017-02-11 MED ORDER — LACTATED RINGERS IV SOLN
INTRAVENOUS | Status: DC
  Administered 2017-02-11 (×2): via INTRAVENOUS
  Filled 2017-02-11: qty 1000

## 2017-02-11 MED ORDER — TRIMETHOPRIM 100 MG PO TABS
100.0000 mg | ORAL_TABLET | ORAL | 1 refills | Status: DC
Start: 2017-02-11 — End: 2017-12-31

## 2017-02-11 MED ORDER — KETOROLAC TROMETHAMINE 30 MG/ML IJ SOLN
30.0000 mg | Freq: Once | INTRAMUSCULAR | Status: DC | PRN
  Filled 2017-02-11: qty 1

## 2017-02-11 MED ORDER — PROPOFOL 10 MG/ML IV BOLUS
INTRAVENOUS | Status: AC
  Filled 2017-02-11: qty 20

## 2017-02-11 MED ORDER — FENTANYL CITRATE (PF) 100 MCG/2ML IJ SOLN
INTRAMUSCULAR | Status: DC | PRN
  Administered 2017-02-11 (×6): 25 ug via INTRAVENOUS

## 2017-02-11 MED ORDER — MIDAZOLAM HCL 5 MG/5ML IJ SOLN
INTRAMUSCULAR | Status: DC | PRN
  Administered 2017-02-11: 2 mg via INTRAVENOUS

## 2017-02-11 MED ORDER — TRAMADOL-ACETAMINOPHEN 37.5-325 MG PO TABS: 1.0000 | ORAL_TABLET | Freq: Four times a day (QID) | ORAL | 0 refills | Status: DC | PRN

## 2017-02-11 MED ORDER — MIDAZOLAM HCL 2 MG/2ML IJ SOLN
INTRAMUSCULAR | Status: AC
  Filled 2017-02-11: qty 2

## 2017-02-11 MED ORDER — PHENAZOPYRIDINE HCL 200 MG PO TABS: 200.0000 mg | ORAL_TABLET | Freq: Three times a day (TID) | ORAL | 0 refills | Status: DC | PRN

## 2017-02-11 SURGICAL SUPPLY — 29 items
BAG DRAIN URO-CYSTO SKYTR STRL (DRAIN) ×3 IMPLANT
BAG URINE DRAINAGE (UROLOGICAL SUPPLIES) ×3 IMPLANT
CATH COUDE FOLEY 2W 5CC 18FR (CATHETERS) IMPLANT
CATH FOLEY 2WAY SLVR  5CC 18FR (CATHETERS)
CATH FOLEY 2WAY SLVR 5CC 18FR (CATHETERS) IMPLANT
CATH FOLEY 3WAY 30CC 22F (CATHETERS) IMPLANT
CATH HEMA 3WAY 30CC 22FR COUDE (CATHETERS) ×3 IMPLANT
CLOTH BEACON ORANGE TIMEOUT ST (SAFETY) ×3 IMPLANT
ELECT BIVAP BIPO 22/24 DONUT (ELECTROSURGICAL)
ELECT LOOP MED HF 24F 12D (CUTTING LOOP) IMPLANT
ELECTRD BIVAP BIPO 22/24 DONUT (ELECTROSURGICAL) IMPLANT
GLOVE BIO SURGEON STRL SZ7.5 (GLOVE) ×6 IMPLANT
GLOVE BIOGEL PI IND STRL 7.5 (GLOVE) ×1 IMPLANT
GLOVE BIOGEL PI INDICATOR 7.5 (GLOVE) ×2
GOWN STRL REUS W/ TWL XL LVL3 (GOWN DISPOSABLE) ×1 IMPLANT
GOWN STRL REUS W/TWL LRG LVL3 (GOWN DISPOSABLE) ×3 IMPLANT
GOWN STRL REUS W/TWL XL LVL3 (GOWN DISPOSABLE) ×5 IMPLANT
HOLDER FOLEY CATH W/STRAP (MISCELLANEOUS) ×3 IMPLANT
IV NS IRRIG 3000ML ARTHROMATIC (IV SOLUTION) ×9 IMPLANT
IV SET EXTENSION GRAVITY 40 LF (IV SETS) IMPLANT
KIT RM TURNOVER CYSTO AR (KITS) ×3 IMPLANT
LASER REVOLIX PROCEDURE (MISCELLANEOUS) ×3 IMPLANT
LOOP CUT BIPOLAR 24F LRG (ELECTROSURGICAL) IMPLANT
MANIFOLD NEPTUNE II (INSTRUMENTS) ×3 IMPLANT
PACK CYSTO (CUSTOM PROCEDURE TRAY) ×3 IMPLANT
SYR 30ML LL (SYRINGE) ×3 IMPLANT
SYRINGE IRR TOOMEY STRL 70CC (SYRINGE) ×3 IMPLANT
TUBE CONNECTING 12'X1/4 (SUCTIONS)
TUBE CONNECTING 12X1/4 (SUCTIONS) IMPLANT

## 2017-02-11 NOTE — Interval H&P Note (Signed)
History and Physical Interval Note:  02/11/2017 10:02 AM  Daryl Adkins  has presented today for surgery, with the diagnosis of BENIGN PROSTATE HYPERPLASIA  The various methods of treatment have been discussed with the patient and family. After consideration of risks, benefits and other options for treatment, the patient has consented to  Procedure(s): THULIUM LASER TURP (TRANSURETHRAL RESECTION OF PROSTATE) (N/A) as a surgical intervention .  The patient's history has been reviewed, patient examined, no change in status, stable for surgery.  I have reviewed the patient's chart and labs.  Questions were answered to the patient's satisfaction.     [name] I Yee Joss GOAL:  Rx of BPH.   BPH failing vesicare and rapaflo, with PSA 10.13 with negative Bx, and 8cc/sec peak flow/detrusor pressure 57cm     H20.  He will have BNI stopping 1 cm proximal to veru I an attempt to preserve antegrade ejaculation, with minimal lateral lobe vaporization.  LIKLIHOOD OF SUCCESS: Excellent: technique as above: Achievable with thulium laser. Will need foley for 2-3 days post op.  A:TERNATE Rx: Pt has failed maximum medical therapy. Will try minimal surgical therapy.  DISABILITY:  Foley for 2-3 days.

## 2017-02-11 NOTE — Discharge Instructions (Addendum)
Post Anesthesia Home Care Instructions  Activity: Get plenty of rest for the remainder of the day. A responsible individual must stay with you for 24 hours following the procedure.  For the next 24 hours, DO NOT: -Drive a car -Advertising copywriter -Drink alcoholic beverages -Take any medication unless instructed by your physician -Make any legal decisions or sign important papers.  Meals: Start with liquid foods such as gelatin or soup. Progress to regular foods as tolerated. Avoid greasy, spicy, heavy foods. If nausea and/or vomiting occur, drink only clear liquids until the nausea and/or vomiting subsides. Call your physician if vomiting continues.  Special Instructions/Symptoms: Your throat may feel dry or sore from the anesthesia or the breathing tube placed in your throat during surgery. If this causes discomfort, gargle with warm salt water. The discomfort should disappear within 24 hours.  If you had a scopolamine patch placed behind your ear for the management of post- operative nausea and/or vomiting:  1. The medication in the patch is effective for 72 hours, after which it should be removed.  Wrap patch in a tissue and discard in the trash. Wash hands thoroughly with soap and water. 2. You may remove the patch earlier than 72 hours if you experience unpleasant side effects which may include dry mouth, dizziness or visual disturbances. 3. Avoid touching the patch. Wash your hands with soap and water after contact with the patch.   Benign Prostatic Hyperplasia Benign prostatic hyperplasia is when the prostate gland is bigger than normal (enlarged). The prostate is a gland that produces the fluid that goes into semen. It is near the opening to the bladder and it surrounds the tube that drains urine out of the body (urethra). Benign prostatic hyperplasia is common among older men and it typically causes problems with urinating. The prostate grows slowly as you age. As the prostate  grows, it can pinch the urethra. This causes the bladder to work too hard to pass urine, which leads to a thickened bladder wall. The bladder may eventually become weak and unable to empty completely. What are the causes? The exact cause of this condition is not known. It may be related to changes in hormones as the body ages. What increases the risk? You are more likely to develop this condition if:  You have a family history of the condition.  You are age 18 or older.  You have a history of erectile dysfunction.  You do not exercise.  You have certain medical conditions, including:  Type 2 diabetes.  Obesity.  Heart and circulatory disease. What are the signs or symptoms? Symptoms of this condition include:  Weak or interrupted urine stream.  Dribbling or leaking urine.  Feeling like the bladder has not emptied completely.  Difficulty starting urination.  Getting up frequently at night to urinate.  Urinating more often (8 or more times a day).  Accidental loss of urine (urinary incontinence).  Pain during urination or ejaculation.  Urine with an unusual smell or color. The size of the prostate does not always determine the severity of the symptoms. For example, a man with a large prostate may experience minor symptoms, or a man with a smaller prostate may experience a severe blockage. How is this diagnosed? This condition may be diagnosed based on:  Your medical history and symptoms.  A physical exam. This usually includes a digital rectal exam. During this exam, your health care provider places a gloved, lubricated finger into the rectum to feel the size of  the prostate.  A blood test. This test checks for high levels of a protein that is produced by the prostate (prostate specific antigen, PSA).  Tests to examine how well the urethra and bladder are functioning (urodynamic tests).  Cystoscopy. For this test, a small, tube-shaped instrument (cystoscope) is used  to look inside the urethra and bladder. The cystoscope is placed into the urinary tract through the opening at the tip of the penis.  Urine tests.  Ultrasound. How is this treated? Treatment for this condition depends on how severe your symptoms are. Treatment may include:  Active surveillance or "watchful waiting." If your symptoms are mild, your health care provider may delay treatment and ask you to keep track of your symptoms. You will have regular checkups to examine the size of your prostate, discuss symptoms, and determine whether treatment is needed.  Medicines. These may be used to:  Stop prostate growth.  Shrink the prostate.  Relieve symptoms.  Lifestyle changes, including:  Pelvic floor muscle exercises. The pelvic floor muscles are a group of muscles that relax when you urinate.  Bladder training. This involves exercises that train the bladder to hold more urine for longer periods.  Reducing the amount of liquid that you drink. This is especially important before sleeping and before long periods of time spent in public.  Reducing the amount of caffeine and alcohol that you drink.  Treating or preventing constipation.  Surgery to reduce the size of the prostate or widen the urethra. This is typically done if your symptoms are severe or there are serious complications from the enlarged prostate. Follow these instructions at home: Medicines   Take over-the-counter and prescription medicines as told by your health care provider.  Avoid certain medicines, such as decongestants, antihistamines, and some prescription medicines as told by your health care provider. Ask your health care provider which medicines you should avoid. General instructions   Monitor your symptoms for any changes. Tell your health care provider about any changes.  Give yourself time when you urinate.  Avoid certain beverages that can irritate the bladder, such as:  Alcohol.  Caffeinated  drinks like coffee, tea, and cola.  Avoid drinking large amounts of liquid before bed or before going out in public.  Do pelvic floor muscle or bladder training exercises as told by your health care provider.  Keep all follow-up visits as told by your health care provider. This is important. Contact a health care provider if:  Your develop new or worse symptoms.  You have trouble getting or maintaining an erection.  You have a fever.  You have pain or burning during urination.  You have blood in your urine. Get help right away if:  You have severe pain when urinating.  You cannot urinate.  You have severe pain in your abdomen.  You are dizzy.  You faint.  You have severe back pain.  Your urine is dark red and difficult to see through.  You have large blood clots in your urine.  You have severe pain after an erection.  You have chest pain, dizziness, or nausea during sexual activity. Summary  The prostate is a gland that produces the fluid that goes into semen. It is near the opening to the bladder and it surrounds the tube that drains urine out of the body (urethra).  Benign prostatic hyperplasia is common among older men and it typically causes problems with urinating.  If your symptoms are mild, your health care provider may delay treatment  and ask you to keep track of your symptoms. You will have regular checkups to examine the size of your prostate, discuss symptoms, and determine whether treatment is needed.  If directed, you may need to avoid certain medicines, such as decongestants, antihistamines, and some prescription medicines.  Contact your health care provider if you develop new or worse symptoms. This information is not intended to replace advice given to you by your health care provider. Make sure you discuss any questions you have with your health care provider. Document Released: 10/22/2005 Document Revised: 09/10/2016 Document Reviewed:  09/10/2016 Elsevier Interactive Patient Education  2017 [employer]   Transurethral Resection of the Prostate Transurethral resection of the prostate (TURP) is the removal (resection) of part of the gland that produces semen (prostate gland). This procedure is done to treat benign prostatic hyperplasia (BPH). BPH is an abnormal, noncancerous (benign) increase in the number of cells that make up the prostate tissue. BPH causes the prostate to get bigger. The enlarged prostate can push against or block the tube that drains urine from the bladder out of the body (urethra). BPH can affect normal urine flow by causing bladder infections, difficulty controlling bladder function, and difficulty emptying the bladder. The goal of TURP is to remove enough prostate tissue to allow for a normal flow of urine. In a transurethral resection, a thin telescope with a light, a tiny camera, and an electric cutting edge (resectoscope) is passed through the urethra and into the prostate. The opening of the urethra is at the end of the penis. Tell a health care provider about:  Any allergies you have.  All medicines you are taking, including vitamins, herbs, eye drops, creams, and over-the-counter medicines.  Any problems you or family members have had with anesthetic medicines.  Any blood disorders you have.  Any surgeries you have had.  Any medical conditions you have.  Any prostate infections you have had. What are the risks? Generally, this is a safe procedure. However, problems may occur, including:  Infection.  Bleeding.  Allergic reactions to medicines.  Damage to other structures or organs, such as:  The urethra.  The bladder.  Muscles that surround the prostate.  Difficulty getting an erection.  Difficulty controlling urination (incontinence).  Scarring, which may cause problems with urine flow. What happens before the procedure?  Follow instructions from your health care provider  about eating or drinking restrictions.  Ask your health care provider about:  Changing or stopping your regular medicines. This is especially important if you are taking diabetes medicines or blood thinners.  Taking medicines such as aspirin and ibuprofen. These medicines can thin your blood. Do not take these medicines before your procedure if your health care provider instructs you not to.  You may have a physical exam.  You may have a blood or urine sample taken.  You may be given antibiotic medicine to help prevent infection.  Ask your health care provider how your surgical site will be marked or identified.  Plan to have someone take you home after the procedure. You may not be able to drive for up to 10 days after your procedure. What happens during the procedure?  To reduce your risk of infection:  Your health care team will wash or sanitize their hands.  Your skin will be washed with soap.  An IV tube will be inserted into one of your veins.  You will be given one or more of the following:  A medicine to help you  relax (sedative).  A medicine to make you fall asleep (general anesthetic).  A medicine that is injected into your spine to numb the area below and slightly above the injection site (spinal anesthetic).  Your legs will be placed in foot rests (stirrups) so that your legs are apart and your knees are bent.  The resectoscope will be passed through your urethra to your prostate.  Parts of your prostate will be resected using the cutting edge of the resectoscope.  The resectoscope will be removed.  A thin, flexible tube (catheter) will be passed through your urethra and into your bladder. The catheter will drain urine into a bag outside of your body.  Fluid may be passed through the catheter to keep the catheter open. The procedure may vary among health care providers and hospitals. What happens after the procedure?  Your blood pressure, heart rate,  breathing rate, and blood oxygen level will be monitored often until the medicines you were given have worn off.  You may continue to receive fluids and medicines through an IV tube.  You may have some pain. Pain medicine will be available to help you.  You will have a catheter draining your urine.  You may have blood in your urine. Your catheter may be kept in until your urine is clear.  Your urinary drainage will be monitored. If necessary, your bladder may be rinsed out (irrigated) through your catheter.  You will be encouraged to walk around as soon as possible.  You may have to wear compression stockings. These stockings help prevent blood clots and reduce swelling in your legs.  Do not drive for 24 hours if you received a sedative. This information is not intended to replace advice given to you by your health care provider. Make sure you discuss any questions you have with your health care provider. Document Released: 10/22/2005 Document Revised: 06/24/2016 Document Reviewed: 07/14/2015 Elsevier Interactive Patient Education  2017 [employer].

## 2017-02-11 NOTE — Anesthesia Preprocedure Evaluation (Signed)
Anesthesia Evaluation  Patient identified by MRN, date of birth, ID band Patient awake    Reviewed: Allergy & Precautions, NPO status , Patient's Chart, lab work & pertinent test results  Airway Mallampati: I       Dental  (+) Teeth Intact, Poor Dentition   Pulmonary sleep apnea and Continuous Positive Airway Pressure Ventilation ,    Pulmonary exam normal        Cardiovascular hypertension, Pt. on medications Normal cardiovascular exam     Neuro/Psych negative psych ROS   GI/Hepatic negative GI ROS, Neg liver ROS,   Endo/Other  negative endocrine ROS  Renal/GU negative Renal ROS     Musculoskeletal negative musculoskeletal ROS (+)   Abdominal Normal abdominal exam  (+)   Peds  Hematology negative hematology ROS (+)   Anesthesia Other Findings   Reproductive/Obstetrics                             Anesthesia Physical Anesthesia Plan  ASA: II  Anesthesia Plan: General   Post-op Pain Management:    Induction: Intravenous  Airway Management Planned: LMA  Additional Equipment:   Intra-op Plan:   Post-operative Plan:   Informed Consent: I have reviewed the patients History and Physical, chart, labs and discussed the procedure including the risks, benefits and alternatives for the proposed anesthesia with the patient or authorized representative who has indicated his/her understanding and acceptance.   Dental advisory given  Plan Discussed with: CRNA and Surgeon  Anesthesia Plan Comments:         Anesthesia Quick Evaluation

## 2017-02-11 NOTE — Anesthesia Postprocedure Evaluation (Addendum)
Anesthesia Post Note  Patient: Daryl Adkins  Procedure(s) Performed: Procedure(s) (LRB): THULIUM LASER TURP (TRANSURETHRAL RESECTION OF PROSTATE) (N/A)  Patient location during evaluation: PACU Anesthesia Type: General Level of consciousness: awake Pain management: pain level controlled Vital Signs Assessment: post-procedure vital signs reviewed and stable Respiratory status: spontaneous breathing Cardiovascular status: stable Postop Assessment: no signs of nausea or vomiting Anesthetic complications: no        Last Vitals:  Vitals:   02/11/17 1230 02/11/17 1245  BP: 125/72 111/73  Pulse: (!) 52 (!) 52  Resp: 10 10  Temp:      Last Pain:  Vitals:   02/11/17 1138  TempSrc:   PainSc: Asleep   Pain Goal: Patients Stated Pain Goal: 7 (02/11/17 0946)               Brithney Bensen JR,JOHN Susann Givens

## 2017-02-11 NOTE — Anesthesia Procedure Notes (Signed)
Procedure Name: LMA Insertion Date/Time: 02/11/2017 10:39 AM Performed by: Jessica Priest Pre-anesthesia Checklist: Patient identified, Emergency Drugs available, Suction available and Patient being monitored Patient Re-evaluated:Patient Re-evaluated prior to inductionOxygen Delivery Method: Circle system utilized Preoxygenation: Pre-oxygenation with 100% oxygen Intubation Type: IV induction Ventilation: Mask ventilation without difficulty LMA: LMA inserted LMA Size: 5.0 Number of attempts: 1 Airway Equipment and Method: Bite block Placement Confirmation: positive ETCO2 Tube secured with: Tape Dental Injury: Teeth and Oropharynx as per pre-operative assessment

## 2017-02-11 NOTE — Transfer of Care (Signed)
  Last Vitals:  Vitals:   02/11/17 0921  BP: (!) 148/85  Pulse: (!) 53  Resp: 18  Temp: 36.9 C    Last Pain:  Vitals:   02/11/17 0921  TempSrc: Oral      Patients Stated Pain Goal: 7 (02/11/17 0946)  Immediate Anesthesia Transfer of Care Note  Patient: Daryl Adkins  Procedure(s) Performed: Procedure(s) (LRB): THULIUM LASER TURP (TRANSURETHRAL RESECTION OF PROSTATE) (N/A)  Patient Location: PACU  Anesthesia Type: General  Level of Consciousness: awake, alert  and oriented  Airway & Oxygen Therapy: Patient Spontanous Breathing and Patient connected to nasal cannula oxygen  Post-op Assessment: Report given to PACU RN and Post -op Vital signs reviewed and stable  Post vital signs: Reviewed and stable  Complications: No apparent anesthesia complications

## 2017-02-11 NOTE — Op Note (Signed)
Pre-operative diagnosis :   BPH  Postoperative diagnosis:  Same  Operation:  Thulium laser prostatctomy ( bladder neck incision)  Surgeon:  S. Patsi Sears, MD  First assistant: None  Anesthesia:  GEN LMA  Preparation: After appropriate premedication, the patient was brought the operative room, placed on the operating table in the dorsal supine position where general LMA anesthesia was induced. He was then replaced in the dorsal lithotomy position with pubis was prepped with Betadine solution and draped in usual fashion. The arm band was double checked. The history was double checked.  Review history:  Daryl Adkins is a 56 year-old male established patient who is here for follow up regarding further evaluation of BPH and lower urinary tract symptoms.  The patient complains of lower urinary tract symptom(s) that include urgency, weak stream, and nocturia. The patient states his most bothersome symptom(s) are the following: urgency. Patient is currently treated with no rx for his symptoms. His symptoms have been worse over the last year. The patient states if he were to spend the rest of his life with his current urinary condition, he would be unhappy. He complains of other associated symptom(s). He has previously tried Cialis 5 mg, Rapaflo 8 mg for his symptoms.   56 year old male, with history of BPH, failing Vesicare and Rapaflo, and failing finasteride therapy as well. The patient is status post prostate biopsy in 2011 showing BPH only. He has a history of elevated PSA of 10.13 with free fraction of 13%. (Off finasteride).   The patient has had video urodynamics, showing a bladder capacity of 450 cc, with a maximum flow rate of 8 cc/s, and a maximum detrusor pressure of 57 cm of water. He is felt to have a high-pressure, low-flow. We have discussed pursuing Urolift in the past, but it has not been covered by his insurance company. We have also considered thulium laser bladder neck incision.    Statement of  Likelihood of Success: Excellent. TIME-OUT observed.:  Procedure:   Cystourethroscopy was accomplished, showing normal urethra, with large lateral lobes, and elevated bladder neck. The bladder itself showed clear reflux in both ureteral orifices, with trabeculation. There was no evidence of cellules. There was no bladder stone, tumor, or diverticular formation.  Because the patient had a desire to avoid retrograde actuation, I elected to create a trough at the 5:00 position, and to stop the incision 1 cm proximal to the Veru. This was accomplished using the thulium laser, with the 800  fiber, with some laser vaporization of the right lateral lobe. Laser energy was used from 100 J to 150 J energy. Some bleeding was noted within the prostate, and this was coagulated using the laser.  Following vaporization of tissue, a 22 three-way Foley catheter was passed, and the catheter irrigated to a patent color. There were no clots. I elected to place the catheter to traction, and we'll use continuous irrigation while in the recovery room. The patient will be allowed to be discharged with traction, to be removed in 24 hours. The catheter will be removed in 2-3 days. The patient received IV antibiotics, a B and O suppository, and 15 mg of IV Toradol.

## 2017-02-12 ENCOUNTER — Encounter (HOSPITAL_BASED_OUTPATIENT_CLINIC_OR_DEPARTMENT_OTHER): Payer: Self-pay | Admitting: Urology

## 2017-02-25 ENCOUNTER — Other Ambulatory Visit: Payer: Self-pay | Admitting: Internal Medicine

## 2017-03-31 ENCOUNTER — Other Ambulatory Visit: Payer: Self-pay | Admitting: Internal Medicine

## 2017-04-12 NOTE — Addendum Note (Signed)
Addendum  created 04/12/17 1146 by Mariellen Blaney, MD   Sign clinical note    

## 2017-04-18 ENCOUNTER — Other Ambulatory Visit: Payer: Self-pay | Admitting: Internal Medicine

## 2017-05-04 ENCOUNTER — Other Ambulatory Visit: Payer: Self-pay | Admitting: Internal Medicine

## 2017-05-19 ENCOUNTER — Other Ambulatory Visit: Payer: Self-pay | Admitting: Internal Medicine

## 2017-12-31 ENCOUNTER — Encounter: Payer: Self-pay | Admitting: Internal Medicine

## 2017-12-31 ENCOUNTER — Ambulatory Visit (INDEPENDENT_AMBULATORY_CARE_PROVIDER_SITE_OTHER): Payer: Managed Care, Other (non HMO) | Admitting: Internal Medicine

## 2017-12-31 VITALS — BP 148/82 | HR 60 | Temp 97.8°F | Resp 14 | Ht 68.0 in | Wt 192.5 lb

## 2017-12-31 DIAGNOSIS — I10 Essential (primary) hypertension: Secondary | ICD-10-CM | POA: Diagnosis not present

## 2017-12-31 DIAGNOSIS — R946 Abnormal results of thyroid function studies: Secondary | ICD-10-CM | POA: Diagnosis not present

## 2017-12-31 LAB — CBC WITH DIFFERENTIAL/PLATELET
BASOS ABS: 0 10*3/uL (ref 0.0–0.1)
BASOS PCT: 0.6 % (ref 0.0–3.0)
EOS ABS: 0.1 10*3/uL (ref 0.0–0.7)
Eosinophils Relative: 1.5 % (ref 0.0–5.0)
HCT: 45.8 % (ref 39.0–52.0)
Hemoglobin: 15.4 g/dL (ref 13.0–17.0)
Lymphocytes Relative: 26.8 % (ref 12.0–46.0)
Lymphs Abs: 2 10*3/uL (ref 0.7–4.0)
MCHC: 33.6 g/dL (ref 30.0–36.0)
MCV: 87.7 fl (ref 78.0–100.0)
MONOS PCT: 6.5 % (ref 3.0–12.0)
Monocytes Absolute: 0.5 10*3/uL (ref 0.1–1.0)
NEUTROS ABS: 4.9 10*3/uL (ref 1.4–7.7)
NEUTROS PCT: 64.6 % (ref 43.0–77.0)
PLATELETS: 250 10*3/uL (ref 150.0–400.0)
RBC: 5.23 Mil/uL (ref 4.22–5.81)
RDW: 13.7 % (ref 11.5–15.5)
WBC: 7.6 10*3/uL (ref 4.0–10.5)

## 2017-12-31 LAB — COMPREHENSIVE METABOLIC PANEL
ALT: 15 U/L (ref 0–53)
AST: 16 U/L (ref 0–37)
Albumin: 4.1 g/dL (ref 3.5–5.2)
Alkaline Phosphatase: 98 U/L (ref 39–117)
BILIRUBIN TOTAL: 0.5 mg/dL (ref 0.2–1.2)
BUN: 17 mg/dL (ref 6–23)
CO2: 26 meq/L (ref 19–32)
Calcium: 9.4 mg/dL (ref 8.4–10.5)
Chloride: 106 mEq/L (ref 96–112)
Creatinine, Ser: 1.13 mg/dL (ref 0.40–1.50)
GFR: 85.76 mL/min (ref >60.00)
GLUCOSE: 90 mg/dL (ref 70–99)
Potassium: 3.4 mEq/L — ABNORMAL LOW (ref 3.5–5.1)
Sodium: 141 mEq/L (ref 135–145)
Total Protein: 7.6 g/dL (ref 6.0–8.3)

## 2017-12-31 LAB — T4, FREE: FREE T4: 0.51 ng/dL — AB (ref 0.60–1.60)

## 2017-12-31 LAB — TSH: TSH: 5.73 u[IU]/mL — ABNORMAL HIGH (ref 0.35–4.50)

## 2017-12-31 LAB — T3, FREE: T3, Free: 3.7 pg/mL (ref 2.3–4.2)

## 2017-12-31 MED ORDER — AMLODIPINE BESYLATE 5 MG PO TABS: 5.0000 mg | ORAL_TABLET | Freq: Every day | ORAL | 6 refills | Status: DC

## 2017-12-31 NOTE — Progress Notes (Signed)
Subjective:    Patient ID: Daryl Adkins, male    DOB: 06-09-1961, 57 y.o.   MRN: [id number]  DOS:  12/31/2017 Type of visit - description : Management of HTN, last visit 2017 Interval history: Has not taking BP medications in a while. 2 weeks ago were BP was 153/90. When he goes to his urologist he has been told BPs are okay.  BP Readings from Last 3 Encounters:  12/31/17 (!) 148/82  02/11/17 127/81  08/27/16 120/82     Review of Systems  Denies chest pain, difficulty breathing. No constipation or edema Eating is mostly healthy, not able to exercise much  Past Medical History:  Diagnosis Date  . Benign localized prostatic hyperplasia with lower urinary tract symptoms (LUTS)   . Complication of anesthesia    HARD TO WAKE  . Elevated PSA   . Erectile dysfunction   . Feeling of incomplete bladder emptying   . Hypertension   . Migraine headache   . Open-angle glaucoma of both eyes   . OSA on CPAP followed by dr Craige Cottasood   per study 09-13-2087 mild osa  . Wears glasses     Past Surgical History:  Procedure Laterality Date  . [facility]  . INGUINAL HERNIA REPAIR Bilateral 2002  . PROSTATE BIOPSY  12-2009   no cancer   . THULIUM LASER TURP (TRANSURETHRAL RESECTION OF PROSTATE) N/A 02/11/2017   Procedure: THULIUM LASER TURP (TRANSURETHRAL RESECTION OF PROSTATE);  Surgeon: Jethro BolusSigmund Tannenbaum, MD;  [facility];  Service: Urology;  Laterality: N/A;  . TONSILLECTOMY  age 34  . UMBILICAL HERNIA REPAIR  2006    Social History   Socioeconomic History  . Marital status: Married    Spouse name: Not on file  . Number of children: 5  . Years of education: Not on file  . Highest education level: Not on file  Social Needs  . Financial resource strain: Not on file  . Food insecurity - worry: Not on file  . Food insecurity - inability: Not on file  . Transportation needs - medical: Not on file  . Transportation needs -  non-medical: Not on file  Occupational History  . Occupation: [employer]  Tobacco Use  . Smoking status: Never Smoker  . Smokeless tobacco: Never Used  Substance and Sexual Activity  . Alcohol use: Yes    Comment: OCCASIONAL  . Drug use: No  . Sexual activity: Not on file    Comment: VASECTOMY  Other Topics Concern  . Not on file  Social History Narrative   Lives w/ wife      Allergies as of 12/31/2017   No Known Allergies     Medication List        Accurate as of 12/31/17 11:59 PM. Always use your most recent med list.          amLODipine 5 MG tablet Commonly known as:  NORVASC Take 1 tablet (5 mg total) by mouth daily.   brinzolamide 1 % ophthalmic suspension Commonly known as:  AZOPT Place 1 drop into the right eye 2 (two) times daily.   latanoprost 0.005 % ophthalmic solution Commonly known as:  XALATAN Place 1 drop into both eyes at bedtime.          Objective:   Physical Exam BP (!) 148/82 (BP Location: Left Arm, Patient Position: Sitting, Cuff Size: Normal)   Pulse 60   Temp 97.8 F (36.6 C) (Oral)  Resp 14   Ht 5\' 8"  (1.727 m)   Wt 192 lb 8 oz (87.3 kg)   SpO2 98%   BMI 29.27 kg/m  General:   Well developed, well nourished . NAD.  HEENT:  Normocephalic . Face symmetric, atraumatic Lungs:  CTA B Normal respiratory effort, no intercostal retractions, no accessory muscle use. Heart: RRR,  no murmur.  No pretibial edema bilaterally  Skin: Not pale. Not jaundice Neurologic:  alert & oriented X3.  Speech normal, gait appropriate for age and unassisted Psych--  Cognition and judgment appear intact.  Cooperative with normal attention span and concentration.  Behavior appropriate. No anxious or depressed appearing.      Assessment & Plan:       Assessment HTN started meds 2013 Migraine headaches Elevated TSH OSA-- on Cpap Glaucoma BPH, elevated PSA , (-) prostate BX 2011, TURP 02-2017, Dr Patsi Sears  PLAN: HTN: Used to be on  amlodipine and losartan, not taking meds for a while, BP is slightly elevated, we talk about diet, exercise.  Will check labs including a CMP and CBC.  Will restart amlodipine and consider add losartan in the future if needed.  Recommend to check ambulatory BPs.  See AVS Increased TSH: Checking labs RTC CPX 4 months

## 2017-12-31 NOTE — Patient Instructions (Signed)
GO TO THE LAB : Get the blood work     GO TO THE FRONT DESK Schedule your next appointment for a  Physical exam in 4 months    Check the  blood pressure 2 or 3 times a   Week  Be sure your blood pressure is between 110/65 and  135/85. If it is consistently higher or lower, let me know

## 2017-12-31 NOTE — Progress Notes (Signed)
Pre visit review using our clinic review tool, if applicable. No additional management support is needed unless otherwise documented below in the visit note. 

## 2018-01-01 NOTE — Assessment & Plan Note (Signed)
HTN: Used to be on amlodipine and losartan, not taking meds for a while, BP is slightly elevated, we talk about diet, exercise.  Will check labs including a CMP and CBC.  Will restart amlodipine and consider add losartan in the future if needed.  Recommend to check ambulatory BPs.  See AVS Increased TSH: Checking labs RTC CPX 4 months

## 2018-05-09 ENCOUNTER — Encounter: Payer: Self-pay | Admitting: Internal Medicine

## 2018-05-09 ENCOUNTER — Ambulatory Visit (INDEPENDENT_AMBULATORY_CARE_PROVIDER_SITE_OTHER): Payer: Managed Care, Other (non HMO) | Admitting: Internal Medicine

## 2018-05-09 ENCOUNTER — Encounter: Payer: Self-pay | Admitting: Gastroenterology

## 2018-05-09 VITALS — BP 122/68 | HR 61 | Temp 98.2°F | Resp 14 | Ht 68.0 in | Wt 192.5 lb

## 2018-05-09 DIAGNOSIS — R739 Hyperglycemia, unspecified: Secondary | ICD-10-CM

## 2018-05-09 DIAGNOSIS — Z0000 Encounter for general adult medical examination without abnormal findings: Secondary | ICD-10-CM

## 2018-05-09 DIAGNOSIS — Z1211 Encounter for screening for malignant neoplasm of colon: Secondary | ICD-10-CM

## 2018-05-09 DIAGNOSIS — R202 Paresthesia of skin: Secondary | ICD-10-CM | POA: Diagnosis not present

## 2018-05-09 DIAGNOSIS — R946 Abnormal results of thyroid function studies: Secondary | ICD-10-CM | POA: Diagnosis not present

## 2018-05-09 DIAGNOSIS — E039 Hypothyroidism, unspecified: Secondary | ICD-10-CM | POA: Diagnosis not present

## 2018-05-09 LAB — LIPID PANEL
CHOL/HDL RATIO: 3
Cholesterol: 132 mg/dL (ref 0–200)
HDL: 40.1 mg/dL (ref >39.00)
LDL Cholesterol: 66 mg/dL (ref 0–99)
NONHDL: 92.01
Triglycerides: 131 mg/dL (ref 0.0–149.0)
VLDL: 26.2 mg/dL (ref 0.0–40.0)

## 2018-05-09 LAB — BASIC METABOLIC PANEL
BUN: 14 mg/dL (ref 6–23)
CALCIUM: 9.1 mg/dL (ref 8.4–10.5)
CO2: 25 meq/L (ref 19–32)
CREATININE: 0.94 mg/dL (ref 0.40–1.50)
Chloride: 106 mEq/L (ref 96–112)
GFR: 105.92 mL/min (ref >60.00)
GLUCOSE: 114 mg/dL — AB (ref 70–99)
Potassium: 3.6 mEq/L (ref 3.5–5.1)
SODIUM: 141 meq/L (ref 135–145)

## 2018-05-09 LAB — T3, FREE: T3 FREE: 4 pg/mL (ref 2.3–4.2)

## 2018-05-09 LAB — B12 AND FOLATE PANEL
Folate: 11.9 ng/mL (ref >5.9)
Vitamin B-12: 301 pg/mL (ref 211–911)

## 2018-05-09 LAB — T4, FREE: Free T4: 0.74 ng/dL (ref 0.60–1.60)

## 2018-05-09 LAB — TSH: TSH: 8.79 u[IU]/mL — AB (ref 0.35–4.50)

## 2018-05-09 NOTE — Assessment & Plan Note (Signed)
Tdap 2014, pnm 23 : 2017 CCS: pt states is ready for a cscope, will refer PSA per urology  Labs: BMP, FLP, TFTs, vitamin D, B12 and folic acid Diet and exercise discussed

## 2018-05-09 NOTE — Progress Notes (Signed)
Subjective:    Patient ID: ISABELLA IDA, male    DOB: [birth year], 58 y.o.   MRN: [id number]  DOS:  05/09/2018 Type of visit - description : cpx Interval history: Here for a physical exam   Review of Systems LUTS: Continue with symptoms, mostly urinary frequency. Ambulatory BPs are checked rarely, the last time he check was 130/80 C/o a 1 year h/o  on and off tingling.  Symptoms are random, sometimes in the fingers, sometimes in the toes, they can be bilateral or unilateral, last few minutes. Denies any neck pain, back pain or headache  Other than above, a 14 point review of systems is negative      Past Medical History:  Diagnosis Date  . Benign localized prostatic hyperplasia with lower urinary tract symptoms (LUTS)   . Complication of anesthesia    HARD TO WAKE  . Elevated PSA   . Erectile dysfunction   . Feeling of incomplete bladder emptying   . Hypertension   . Migraine headache   . Open-angle glaucoma of both eyes   . OSA on CPAP followed by dr Craige Cotta   per study 09-13-2087 mild osa  . Wears glasses     Past Surgical History:  Procedure Laterality Date  . GLAUCOMA SURGERY  2013   at [facility]  . INGUINAL HERNIA REPAIR Bilateral 2002  . PROSTATE BIOPSY  12-2009   no cancer   . THULIUM LASER TURP (TRANSURETHRAL RESECTION OF PROSTATE) N/A 02/11/2017   Procedure: THULIUM LASER TURP (TRANSURETHRAL RESECTION OF PROSTATE);  Surgeon: Jethro Bolus, MD;  Location: [facility];  Service: Urology;  Laterality: N/A;  . TONSILLECTOMY  age 31  . UMBILICAL HERNIA REPAIR  2006   Family History  Problem Relation Age of Onset  . Diabetes Mother   . Hypertension Mother   . Colon cancer Neg Hx   . Prostate cancer Neg Hx   . CAD Neg Hx   . Stroke Neg Hx     Social History   Socioeconomic History  . Marital status: Married    Spouse name: Not on file  . Number of children: 5  . Years of education: Not on file  . Highest education level: Not on file    Occupational History  . Occupation: [employer]  Social Needs  . Financial resource strain: Not on file  . Food insecurity:    Worry: Not on file    Inability: Not on file  . Transportation needs:    Medical: Not on file    Non-medical: Not on file  Tobacco Use  . Smoking status: Never Smoker  . Smokeless tobacco: Never Used  Substance and Sexual Activity  . Alcohol use: Yes    Comment: OCCASIONAL  . Drug use: No  . Sexual activity: Not on file    Comment: VASECTOMY  Lifestyle  . Physical activity:    Days per week: Not on file    Minutes per session: Not on file  . Stress: Not on file  Relationships  . Social connections:    Talks on phone: Not on file    Gets together: Not on file    Attends religious service: Not on file    Active member of club or organization: Not on file    Attends meetings of clubs or organizations: Not on file    Relationship status: Not on file  . Intimate partner violence:    Fear of current or ex partner: Not on file  Emotionally abused: Not on file    Physically abused: Not on file    Forced sexual activity: Not on file  Other Topics Concern  . Not on file  Social History Narrative   Lives w/ wife      Allergies as of 05/09/2018   No Known Allergies     Medication List        Accurate as of 05/09/18 11:59 PM. Always use your most recent med list.          amLODipine 5 MG tablet Commonly known as:  NORVASC Take 1 tablet (5 mg total) by mouth daily.   brinzolamide 1 % ophthalmic suspension Commonly known as:  AZOPT Place 1 drop into the right eye 2 (two) times daily.   latanoprost 0.005 % ophthalmic solution Commonly known as:  XALATAN Place 1 drop into both eyes at bedtime.          Objective:   Physical Exam BP 122/68 (BP Location: Left Arm, Patient Position: Sitting, Cuff Size: Small)   Pulse 61   Temp 98.2 F (36.8 C) (Oral)   Resp 14   Ht 5\' 8"  (1.727 m)   Wt 192 lb 8 oz (87.3 kg)   SpO2 97%   BMI 29.27 kg/m   General: Well developed, NAD, see BMI.  Neck: No  thyromegaly  HEENT:  Normocephalic . Face symmetric, atraumatic Lungs:  CTA B Normal respiratory effort, no intercostal retractions, no accessory muscle use. Heart: RRR,  no murmur.  No pretibial edema bilaterally  Abdomen:  Not distended, soft, non-tender. No rebound or rigidity.   Skin: Exposed areas without rash. Not pale. Not jaundice Neurologic:  alert & oriented X3.  Speech normal, gait appropriate for age and unassisted Strength symmetric and appropriate for age.  Psych: Cognition and judgment appear intact.  Cooperative with normal attention span and concentration.  Behavior appropriate. No anxious or depressed appearing.     Assessment & Plan:   Assessment HTN started meds 2013 Migraine headaches Elevated TSH OSA-- on Cpap Glaucoma BPH, elevated PSA , (-) prostate BX 2011, TURP 02-2017, Dr Patsi Searsannenbaum  PLAN: HTN: Continue amlodipine, recommend to check BPs regularly Elevated TSH: TFTs today OSA: On CPAP Paresthesias: As described above, atypical, check B12, folic acid and vitamin D. BPH: Symptoms seem do not improve much after TURP, recommend to discuss with urology RTC 1 year

## 2018-05-09 NOTE — Progress Notes (Signed)
Pre visit review using our clinic review tool, if applicable. No additional management support is needed unless otherwise documented below in the visit note. 

## 2018-05-09 NOTE — Patient Instructions (Signed)
GO TO THE LAB : Get the blood work     GO TO THE FRONT DESK Schedule your next appointment for a   Physical exam in 1 year  Check the  blood pressure 2   times a month  Be sure your blood pressure is between 110/65 and  135/85. If it is consistently higher or lower, let me know    

## 2018-05-10 NOTE — Assessment & Plan Note (Signed)
HTN: Continue amlodipine, recommend to check BPs regularly Elevated TSH: TFTs today OSA: On CPAP Paresthesias: As described above, atypical, check B12, folic acid and vitamin D. BPH: Symptoms seem do not improve much after TURP, recommend to discuss with urology RTC 1 year

## 2018-05-11 LAB — VITAMIN D 1,25 DIHYDROXY
VITAMIN D 1, 25 (OH) TOTAL: 62 pg/mL (ref 18–72)
Vitamin D2 1, 25 (OH)2: <8 pg/mL
Vitamin D3 1, 25 (OH)2: 62 pg/mL

## 2018-05-15 NOTE — Addendum Note (Signed)
Addended byConrad New Deal: Prapti Grussing D on: 05/15/2018 01:03 PM   Modules accepted: Orders

## 2018-05-28 ENCOUNTER — Ambulatory Visit (AMBULATORY_SURGERY_CENTER): Payer: Self-pay

## 2018-05-28 ENCOUNTER — Ambulatory Visit: Payer: Managed Care, Other (non HMO) | Admitting: Pulmonary Disease

## 2018-05-28 ENCOUNTER — Ambulatory Visit (INDEPENDENT_AMBULATORY_CARE_PROVIDER_SITE_OTHER): Payer: Managed Care, Other (non HMO) | Admitting: Pulmonary Disease

## 2018-05-28 ENCOUNTER — Encounter: Payer: Self-pay | Admitting: Pulmonary Disease

## 2018-05-28 VITALS — BP 140/82 | HR 74 | Ht 68.0 in | Wt 196.8 lb

## 2018-05-28 VITALS — Ht 68.0 in | Wt 194.0 lb

## 2018-05-28 DIAGNOSIS — G4733 Obstructive sleep apnea (adult) (pediatric): Secondary | ICD-10-CM | POA: Diagnosis not present

## 2018-05-28 DIAGNOSIS — Z9989 Dependence on other enabling machines and devices: Secondary | ICD-10-CM

## 2018-05-28 DIAGNOSIS — Z1211 Encounter for screening for malignant neoplasm of colon: Secondary | ICD-10-CM

## 2018-05-28 MED ORDER — SOD PICOSULFATE-MAG OX-CIT ACD 10-3.5-12 MG-GM -GM/160ML PO SOLN: 1.0000 | Freq: Once | ORAL | 0 refills | Status: AC

## 2018-05-28 NOTE — Progress Notes (Signed)
Denies allergies to eggs or soy products. Denies complication of anesthesia or sedation. Denies use of weight loss medication. Denies use of O2.   Emmi instructions declined.  

## 2018-05-28 NOTE — Progress Notes (Signed)
[facility]  Chief Complaint  Patient presents with  . Follow-up    wearing cpap 5-6hr nightly- feels pressure & mask are okay. DME:[facility]    Constitutional: BP 140/82 (BP Location: Left Arm, Cuff Size: Normal)   Pulse 74   Ht 5' 8"  (1.727 m)   Wt 196 lb 12.8 oz (89.3 kg)   SpO2 96%   BMI 29.92 kg/m   History of Present Illness: PRECIOUS GILCHREST is a 57 y.o. male with obstructive sleep apnea.  He comes in for visit every 2 yrs.  He uses CPAP nightly.  No issues with mask, or pressure.  Denies sinus congestion, sore throat, or dry mouth.  He tried getting a new machine before, but cost was too much.  Comprehensive Respiratory Exam:  Appearance - well kempt  ENMT - nasal mucosa moist, turbinates clear, midline nasal septum, no dental lesions, no gingival bleeding, no oral exudates, no tonsillar hypertrophy Neck - no masses, trachea midline, no thyromegaly, no elevation in JVP Respiratory - normal appearance of chest wall, normal respiratory effort w/o accessory muscle use, no dullness on percussion, no wheezing or rales CV - s1s2 regular rate and rhythm, no murmurs, no peripheral edema, no varicosities, radial pulses symmetric GI - soft, non tender Lymph - no adenopathy noted in neck and axillary areas MSK - normal muscle strength and tone, normal gait Ext - no cyanosis, clubbing, or joint inflammation noted Skin - no rashes, lesions, or ulcers Neuro - oriented to person, place, and time Psych - normal mood and affect   Assessment/Plan:  Obstructive sleep apnea. - he reports compliance with therapy and benefit  - will arrange for new CPAP machine at 9 cm H2O and replacement supplies - explained he might need repeat sleep study for insurance coverage of new machine >> should be able to do home sleep study   Patient Instructions  Will arrange for new CPAP machine  Follow up in 2 months after getting new machine    Chesley Mires, MD [place] 05/28/2018, 2:33 PM  Flow Sheet  Sleep tests: PSG ([facility]) 09/13/07 >> AHI 9, SpO2 low 80% CPAP 07/25/16 to 08/27/16 >> used on 30 of 34 nights with average 6 hrs 9 min.  Average AHI 1.3 with CPAP 9 cm H2O  Past Medical History: He  has a past medical history of Allergy, Benign localized prostatic hyperplasia with lower urinary tract symptoms (LUTS), Complication of anesthesia, Depression, Elevated PSA, Erectile dysfunction, Feeling of incomplete bladder emptying, Hypertension, Migraine headache, Open-angle glaucoma of both eyes, OSA on CPAP (followed by dr Halford Chessman), Sleep apnea, and Wears glasses.  Past Surgical History: He  has a past surgical history that includes Glaucoma surgery (2013); Prostate biopsy (12-2009); Umbilical hernia repair (2006); Inguinal hernia repair (Bilateral, 2002); Tonsillectomy (age 40); and Thulium laser TURP (transurethral resection of prostate) (N/A, 02/11/2017).  Family History: His family history includes Diabetes in his mother; Hypertension in his mother.  Social History: He  reports that he has never smoked. He has never used smokeless tobacco. He reports that he drinks alcohol. He reports that he does not use drugs.  Medications: Allergies as of 05/28/2018   No Known Allergies     Medication List        Accurate as of 05/28/18  2:33 PM. Always use your most recent med list.          amLODipine 5 MG tablet Commonly known as:  NORVASC  Take 1 tablet (5 mg total) by mouth daily.   dorzolamide-timolol 22.3-6.8 MG/ML ophthalmic solution Commonly known as:  COSOPT Place 1 drop into both eyes 2 (two) times daily.   latanoprost 0.005 % ophthalmic solution Commonly known as:  XALATAN Place 1 drop into both eyes at bedtime.   OVER THE COUNTER MEDICATION Turmeric, One capsule daily.   Sod Picosulfate-Mag Ox-Cit Acd 10-3.5-12 MG-GM -GM/160ML Soln Commonly known as:  CLENPIQ Take 1 kit by mouth once for 1  dose.

## 2018-05-28 NOTE — Patient Instructions (Signed)
Will arrange for new CPAP machine  Follow up in 2 months after getting new machine  

## 2018-06-16 ENCOUNTER — Encounter: Payer: Self-pay | Admitting: Gastroenterology

## 2018-06-24 ENCOUNTER — Other Ambulatory Visit: Payer: Self-pay

## 2018-06-24 MED ORDER — AMLODIPINE BESYLATE 5 MG PO TABS: 5.0000 mg | ORAL_TABLET | Freq: Every day | ORAL | 1 refills | Status: DC

## 2018-06-27 ENCOUNTER — Encounter: Payer: Self-pay | Admitting: Gastroenterology

## 2018-06-27 ENCOUNTER — Ambulatory Visit (AMBULATORY_SURGERY_CENTER): Payer: Managed Care, Other (non HMO) | Admitting: Gastroenterology

## 2018-06-27 VITALS — BP 96/66 | HR 49 | Temp 99.6°F | Resp 13 | Ht 68.0 in | Wt 196.0 lb

## 2018-06-27 DIAGNOSIS — Z1211 Encounter for screening for malignant neoplasm of colon: Secondary | ICD-10-CM | POA: Diagnosis present

## 2018-06-27 MED ORDER — SODIUM CHLORIDE 0.9 % IV SOLN: 500.0000 mL | Freq: Once | INTRAVENOUS | Status: DC

## 2018-06-27 NOTE — Op Note (Signed)
[facility] Patient Name: Daryl Adkins Procedure Date: 06/27/2018 11:12 AM MRN: [id number] Endoscopist: Lynann Bologna , MD Age: 57 Referring MD:  Date of Birth: 1961-06-15 Gender: Male Account #: [id number] Procedure:                Colonoscopy Indications:              Screening for colorectal malignant neoplasm Medicines:                Monitored Anesthesia Care Procedure:                Pre-Anesthesia Assessment:                           - Prior to the procedure, a History and Physical                            was performed, and patient medications and                            allergies were reviewed. The patient's tolerance of                            previous anesthesia was also reviewed. The risks                            and benefits of the procedure and the sedation                            options and risks were discussed with the patient.                            All questions were answered, and informed consent                            was obtained. Prior Anticoagulants: The patient has                            taken no previous anticoagulant or antiplatelet                            agents. ASA Grade Assessment: I - A normal, healthy                            patient. After reviewing the risks and benefits,                            the patient was deemed in satisfactory condition to                            undergo the procedure.                           After obtaining informed consent, the colonoscope  was passed under direct vision. Throughout the                            procedure, the patient's blood pressure, pulse, and                            oxygen saturations were monitored continuously. The                            Colonoscope was introduced through the anus and                            advanced to the the cecum, identified by                            appendiceal orifice and ileocecal valve.  Then to                            TI. The colonoscopy was performed without                            difficulty. The patient tolerated the procedure                            well. The quality of the bowel preparation was                            excellent. Scope In: 11:18:52 AM Scope Out: 11:28:59 AM Scope Withdrawal Time: 0 hours 7 minutes 44 seconds  Total Procedure Duration: 0 hours 10 minutes 7 seconds  Findings:                 A few small-mouthed diverticula were found in the                            sigmoid colon.                           Non-bleeding minimal internal hemorrhoids were                            found during retroflexion. The hemorrhoids were                            small.                           The exam was otherwise without abnormality on                            direct and retroflexion views. Complications:            No immediate complications. Estimated Blood Loss:     Estimated blood loss: none. Impression:               - Minimal sigmoid diverticulosis                           -  Otherwise normal colonoscopy TI. Recommendation:           - Patient has a contact number available for                            emergencies. The signs and symptoms of potential                            delayed complications were discussed with the                            patient. Return to normal activities tomorrow.                            Written discharge instructions were provided to the                            patient.                           - Resume previous diet.                           - Continue present medications.                           - Repeat colonoscopy in 10 years for screening                            purposes. Earlier, if any new problems or if there                            is any change in family history.                           - Return to GI clinic PRN. Lynann Bolognaajesh Evelyn Aguinaldo, MD 06/27/2018 11:34:59 AM This report has  been signed electronically.

## 2018-06-27 NOTE — Progress Notes (Signed)
Pt's states no medical or surgical changes since previsit or office visit. 

## 2018-06-27 NOTE — Patient Instructions (Signed)
Please read handouts on Diverticulosis and Hemorrhoids. Continue present medications.     YOU HAD AN ENDOSCOPIC PROCEDURE TODAY AT THE [facility]:   Refer to the procedure report that was given to you for any specific questions about what was found during the examination.  If the procedure report does not answer your questions, please call your gastroenterologist to clarify.  If you requested that your care partner not be given the details of your procedure findings, then the procedure report has been included in a sealed envelope for you to review at your convenience later.  YOU SHOULD EXPECT: Some feelings of bloating in the abdomen. Passage of more gas than usual.  Walking can help get rid of the air that was put into your GI tract during the procedure and reduce the bloating. If you had a lower endoscopy (such as a colonoscopy or flexible sigmoidoscopy) you may notice spotting of blood in your stool or on the toilet paper. If you underwent a bowel prep for your procedure, you may not have a normal bowel movement for a few days.  Please Note:  You might notice some irritation and congestion in your nose or some drainage.  This is from the oxygen used during your procedure.  There is no need for concern and it should clear up in a day or so.  SYMPTOMS TO REPORT IMMEDIATELY:   Following lower endoscopy (colonoscopy or flexible sigmoidoscopy):  Excessive amounts of blood in the stool  Significant tenderness or worsening of abdominal pains  Swelling of the abdomen that is new, acute  Fever of 100F or higher    For urgent or emergent issues, a gastroenterologist can be reached at any hour by calling [phone].   DIET:  We do recommend a small meal at first, but then you may proceed to your regular diet.  Drink plenty of fluids but you should avoid alcoholic beverages for 24 hours.  ACTIVITY:  You should plan to take it easy for the rest of today and you should NOT DRIVE  or use heavy machinery until tomorrow (because of the sedation medicines used during the test).    FOLLOW UP: Our staff will call the number listed on your records the next business day following your procedure to check on you and address any questions or concerns that you may have regarding the information given to you following your procedure. If we do not reach you, we will leave a message.  However, if you are feeling well and you are not experiencing any problems, there is no need to return our call.  We will assume that you have returned to your regular daily activities without incident.  If any biopsies were taken you will be contacted by phone or by letter within the next 1-3 weeks.  Please call us at [phone] if you have not heard about the biopsies in 3 weeks.    SIGNATURES/CONFIDENTIALITY: You and/or your care partner have signed paperwork which will be entered into your electronic medical record.  These signatures attest to the fact that that the information above on your After Visit Summary has been reviewed and is understood.  Full responsibility of the confidentiality of this discharge information lies with you and/or your care-partner. 

## 2018-06-27 NOTE — Progress Notes (Signed)
Report given to PACU, vss 

## 2018-06-30 ENCOUNTER — Telehealth: Payer: Self-pay

## 2018-06-30 NOTE — Telephone Encounter (Signed)
  Follow up Call-  Call back number 06/27/2018  Post procedure Call Back phone  # [phone]  [phone]  Permission to leave phone message Yes  Some recent data might be hidden     Patient questions:  Do you have a fever, pain , or abdominal swelling? No. Pain Score  0 *  Have you tolerated food without any problems? Yes.    Have you been able to return to your normal activities? Yes.    Do you have any questions about your discharge instructions: Diet   No. Medications  No. Follow up visit  No.  Do you have questions or concerns about your Care? No.  Actions: * If pain score is 4 or above: No action needed, pain <4.

## 2018-07-24 ENCOUNTER — Ambulatory Visit (INDEPENDENT_AMBULATORY_CARE_PROVIDER_SITE_OTHER): Payer: Managed Care, Other (non HMO) | Admitting: Pulmonary Disease

## 2018-07-24 ENCOUNTER — Encounter: Payer: Self-pay | Admitting: Pulmonary Disease

## 2018-07-24 VITALS — BP 118/80 | HR 54 | Ht 68.0 in | Wt 191.0 lb

## 2018-07-24 DIAGNOSIS — Z9989 Dependence on other enabling machines and devices: Secondary | ICD-10-CM

## 2018-07-24 DIAGNOSIS — Z23 Encounter for immunization: Secondary | ICD-10-CM | POA: Diagnosis not present

## 2018-07-24 DIAGNOSIS — G4733 Obstructive sleep apnea (adult) (pediatric): Secondary | ICD-10-CM | POA: Diagnosis not present

## 2018-07-24 NOTE — Progress Notes (Signed)
[facility]  Chief Complaint  Patient presents with  . Follow-up    Pt is doing well overall with cpap machine.     Constitutional: BP 118/80 (BP Location: Left Arm, Cuff Size: Normal)   Pulse (!) 54   Ht 5\' 8"  (1.727 m)   Wt 191 lb (86.6 kg)   SpO2 97%   BMI 29.04 kg/m   History of Present Illness: Daryl Adkins is a 57 y.o. male with obstructive sleep apnea.  This is visit to see how he is doing after getting new CPAP.  He is surprised how quiet the machine is.  No issues with mask fit.  Not having sinus congestion, sore throat, dry mouth, or aerophagia.  Sleeping well and feels rested.  Comprehensive Respiratory Exam:  Appearance - well kempt  ENMT - nasal mucosa moist, turbinates clear, midline nasal septum, no dental lesions, no gingival bleeding, no oral exudates, no tonsillar hypertrophy Neck - no masses, trachea midline, no thyromegaly, no elevation in JVP Respiratory - normal appearance of chest wall, normal respiratory effort w/o accessory muscle use, no dullness on percussion, no wheezing or rales CV - s1s2 regular rate and rhythm, no murmurs, no peripheral edema, radial pulses symmetric GI - soft, non tender Lymph - no adenopathy noted in neck and axillary areas MSK - normal muscle strength and tone, normal gait Ext - no cyanosis, clubbing, or joint inflammation noted Skin - no rashes, lesions, or ulcers Neuro - oriented to person, place, and time Psych - normal mood and affect   Assessment/Plan:  Obstructive sleep apnea. - he is compliant with CPAP and reports benefit from therapy - continue CPAP 9 cm H2O   Patient Instructions  Flu shot today  Follow up in 1 year    Coralyn HellingVineet Journee Bobrowski, MD [facility] 07/24/2018, 9:16 AM  Flow Sheet  Sleep tests: PSG ([facility]) 09/13/07 >> AHI 9, SpO2 low 80% CPAP 06/24/18 to 07/23/18 >> used on 29 of 30 nights with average 6 hrs 57 min.  Average AHI  1.5 with CPAP 9 cm H2O.  Past Medical History: He  has a past medical history of Allergy, Benign localized prostatic hyperplasia with lower urinary tract symptoms (LUTS), Complication of anesthesia, Depression, Elevated PSA, Erectile dysfunction, Feeling of incomplete bladder emptying, Hypertension, Migraine headache, Open-angle glaucoma of both eyes, OSA on CPAP (followed by dr Craige Cottasood), Sleep apnea, and Wears glasses.  Past Surgical History: He  has a past surgical history that includes Glaucoma surgery (2013); Prostate biopsy (12-2009); Umbilical hernia repair (2006); Inguinal hernia repair (Bilateral, 2002); Tonsillectomy (age 83); and Thulium laser TURP (transurethral resection of prostate) (N/A, 02/11/2017).  Family History: His family history includes Diabetes in his mother; Hypertension in his mother.  Social History: He  reports that he has never smoked. He has never used smokeless tobacco. He reports that he drinks alcohol. He reports that he does not use drugs.  Medications: Allergies as of 07/24/2018   No Known Allergies     Medication List        Accurate as of 07/24/18  9:16 AM. Always use your most recent med list.          amLODipine 5 MG tablet Commonly known as:  NORVASC Take 1 tablet (5 mg total) by mouth daily.   dorzolamide-timolol 22.3-6.8 MG/ML ophthalmic solution Commonly known as:  COSOPT Place 1 drop into both eyes 2 (two) times daily.   latanoprost 0.005 % ophthalmic solution  Commonly known as:  XALATAN Place 1 drop into both eyes at bedtime.

## 2018-07-24 NOTE — Patient Instructions (Signed)
Flu shot today Follow up in 1 year 

## 2018-09-15 ENCOUNTER — Other Ambulatory Visit (INDEPENDENT_AMBULATORY_CARE_PROVIDER_SITE_OTHER): Payer: Managed Care, Other (non HMO)

## 2018-09-15 DIAGNOSIS — R739 Hyperglycemia, unspecified: Secondary | ICD-10-CM | POA: Diagnosis not present

## 2018-09-15 DIAGNOSIS — E039 Hypothyroidism, unspecified: Secondary | ICD-10-CM

## 2018-09-15 LAB — TSH: TSH: 9.86 u[IU]/mL — AB (ref 0.35–4.50)

## 2018-09-15 LAB — T4, FREE: Free T4: 0.61 ng/dL (ref 0.60–1.60)

## 2018-09-15 LAB — T3, FREE: T3 FREE: 3.7 pg/mL (ref 2.3–4.2)

## 2018-09-15 LAB — HEMOGLOBIN A1C: HEMOGLOBIN A1C: 5.6 % (ref 4.6–6.5)

## 2018-09-18 MED ORDER — LEVOTHYROXINE SODIUM 50 MCG PO TABS: 50.0000 ug | ORAL_TABLET | Freq: Every day | ORAL | 3 refills | Status: DC

## 2018-09-18 NOTE — Addendum Note (Signed)
Addended byConrad Shongopovi: Shaydon Lease D on: 09/18/2018 08:31 AM   Modules accepted: Orders

## 2018-11-04 ENCOUNTER — Other Ambulatory Visit: Payer: Self-pay | Admitting: Internal Medicine

## 2018-11-25 ENCOUNTER — Encounter: Payer: Self-pay | Admitting: Internal Medicine

## 2018-12-05 ENCOUNTER — Other Ambulatory Visit (INDEPENDENT_AMBULATORY_CARE_PROVIDER_SITE_OTHER): Payer: Managed Care, Other (non HMO)

## 2018-12-05 DIAGNOSIS — E039 Hypothyroidism, unspecified: Secondary | ICD-10-CM | POA: Diagnosis not present

## 2018-12-05 LAB — TSH: TSH: 4.58 u[IU]/mL — ABNORMAL HIGH (ref 0.35–4.50)

## 2018-12-08 ENCOUNTER — Other Ambulatory Visit: Payer: Self-pay | Admitting: Internal Medicine

## 2018-12-08 MED ORDER — LEVOTHYROXINE SODIUM 88 MCG PO TABS: 88.0000 ug | ORAL_TABLET | Freq: Every day | ORAL | 1 refills | Status: DC

## 2018-12-08 NOTE — Addendum Note (Signed)
Addended byConrad [place] D on: 12/08/2018 08:28 AM   Modules accepted: Orders

## 2019-02-06 ENCOUNTER — Other Ambulatory Visit: Payer: Self-pay | Admitting: Internal Medicine

## 2019-03-23 ENCOUNTER — Telehealth: Payer: Self-pay

## 2019-03-23 NOTE — Telephone Encounter (Signed)
Needs lab appt. TSH ordered.

## 2019-03-23 NOTE — Telephone Encounter (Signed)
Copied from CRM [id number]. Topic: Appointment Scheduling - Scheduling Inquiry for Clinic >> Mar 23, 2019 10:58 AM Deborha Payment wrote: Reason for CRM: patient called back regarding to schedule labs. Patient got message off mychart from [place], [place], [place]. Patient can come in ASAP.  Patient call back # [phone]

## 2019-03-23 NOTE — Telephone Encounter (Signed)
Lab appt scheduled 03/31/2019.  

## 2019-03-31 ENCOUNTER — Other Ambulatory Visit: Payer: Self-pay

## 2019-03-31 ENCOUNTER — Other Ambulatory Visit (INDEPENDENT_AMBULATORY_CARE_PROVIDER_SITE_OTHER): Payer: Managed Care, Other (non HMO)

## 2019-03-31 DIAGNOSIS — E039 Hypothyroidism, unspecified: Secondary | ICD-10-CM

## 2019-03-31 LAB — TSH: TSH: 0.96 u[IU]/mL (ref 0.35–4.50)

## 2019-04-01 MED ORDER — LEVOTHYROXINE SODIUM 88 MCG PO TABS: 88.0000 ug | ORAL_TABLET | Freq: Every day | ORAL | 1 refills | Status: DC

## 2019-04-01 NOTE — Addendum Note (Signed)
Addended byConrad  D on: 04/01/2019 07:48 AM   Modules accepted: Orders

## 2019-05-15 ENCOUNTER — Encounter: Payer: Self-pay | Admitting: Internal Medicine

## 2019-05-15 ENCOUNTER — Other Ambulatory Visit: Payer: Self-pay

## 2019-05-15 ENCOUNTER — Ambulatory Visit (INDEPENDENT_AMBULATORY_CARE_PROVIDER_SITE_OTHER): Payer: Managed Care, Other (non HMO) | Admitting: Internal Medicine

## 2019-05-15 VITALS — BP 126/75 | HR 57 | Temp 98.4°F | Resp 16 | Ht 68.0 in | Wt 198.2 lb

## 2019-05-15 DIAGNOSIS — Z0000 Encounter for general adult medical examination without abnormal findings: Secondary | ICD-10-CM | POA: Diagnosis not present

## 2019-05-15 DIAGNOSIS — R739 Hyperglycemia, unspecified: Secondary | ICD-10-CM | POA: Diagnosis not present

## 2019-05-15 DIAGNOSIS — Z23 Encounter for immunization: Secondary | ICD-10-CM | POA: Diagnosis not present

## 2019-05-15 LAB — CBC WITH DIFFERENTIAL/PLATELET
Basophils Absolute: 0.1 10*3/uL (ref 0.0–0.1)
Basophils Relative: 0.8 % (ref 0.0–3.0)
Eosinophils Absolute: 0.2 10*3/uL (ref 0.0–0.7)
Eosinophils Relative: 2.3 % (ref 0.0–5.0)
HCT: 43.8 % (ref 39.0–52.0)
Hemoglobin: 14.6 g/dL (ref 13.0–17.0)
Lymphocytes Relative: 33.4 % (ref 12.0–46.0)
Lymphs Abs: 2.3 10*3/uL (ref 0.7–4.0)
MCHC: 33.2 g/dL (ref 30.0–36.0)
MCV: 88.4 fl (ref 78.0–100.0)
Monocytes Absolute: 0.4 10*3/uL (ref 0.1–1.0)
Monocytes Relative: 6.5 % (ref 3.0–12.0)
Neutro Abs: 3.9 10*3/uL (ref 1.4–7.7)
Neutrophils Relative %: 57 % (ref 43.0–77.0)
Platelets: 224 10*3/uL (ref 150.0–400.0)
RBC: 4.96 Mil/uL (ref 4.22–5.81)
RDW: 14.6 % (ref 11.5–15.5)
WBC: 6.8 10*3/uL (ref 4.0–10.5)

## 2019-05-15 LAB — LDL CHOLESTEROL, DIRECT: Direct LDL: 64 mg/dL

## 2019-05-15 LAB — LIPID PANEL
Cholesterol: 117 mg/dL (ref 0–200)
HDL: 30.6 mg/dL — ABNORMAL LOW (ref >39.00)
NonHDL: 86.46
Total CHOL/HDL Ratio: 4
Triglycerides: 222 mg/dL — ABNORMAL HIGH (ref 0.0–149.0)
VLDL: 44.4 mg/dL — ABNORMAL HIGH (ref 0.0–40.0)

## 2019-05-15 LAB — COMPREHENSIVE METABOLIC PANEL
ALT: 15 U/L (ref 0–53)
AST: 14 U/L (ref 0–37)
Albumin: 4.3 g/dL (ref 3.5–5.2)
Alkaline Phosphatase: 109 U/L (ref 39–117)
BUN: 15 mg/dL (ref 6–23)
CO2: 24 mEq/L (ref 19–32)
Calcium: 8.6 mg/dL (ref 8.4–10.5)
Chloride: 108 mEq/L (ref 96–112)
Creatinine, Ser: 1.09 mg/dL (ref 0.40–1.50)
GFR: 83.71 mL/min (ref >60.00)
Glucose, Bld: 137 mg/dL — ABNORMAL HIGH (ref 70–99)
Potassium: 3.5 mEq/L (ref 3.5–5.1)
Sodium: 141 mEq/L (ref 135–145)
Total Bilirubin: 0.5 mg/dL (ref 0.2–1.2)
Total Protein: 7.2 g/dL (ref 6.0–8.3)

## 2019-05-15 LAB — HEMOGLOBIN A1C: Hgb A1c MFr Bld: 5.6 % (ref 4.6–6.5)

## 2019-05-15 LAB — TSH: TSH: 3.65 u[IU]/mL (ref 0.35–4.50)

## 2019-05-15 NOTE — Progress Notes (Signed)
Subjective:    Patient ID: Daryl Adkins, male    DOB: May 31, 1961, 58 y.o.   MRN: [id number]  DOS:  05/15/2019 Type of visit - description: CPX No major concerns, he would like to proceed with shingles immunizations   Review of Systems Genitourinary symptoms improved since the last year.  Other than above, a 14 point review of systems is negative     Past Medical History:  Diagnosis Date  . Allergy   . Benign localized prostatic hyperplasia with lower urinary tract symptoms (LUTS)   . Complication of anesthesia    HARD TO WAKE  . Depression   . Elevated PSA   . Erectile dysfunction   . Feeling of incomplete bladder emptying   . Hypertension   . Migraine headache   . Open-angle glaucoma of both eyes   . OSA on CPAP followed by dr Craige Cottasood   per study 09-13-2087 mild osa  . Sleep apnea   . Wears glasses     Past Surgical History:  Procedure Laterality Date  . GLAUCOMA SURGERY  2013   at [facility]  . INGUINAL HERNIA REPAIR Bilateral 2002  . PROSTATE BIOPSY  12-2009   no cancer   . THULIUM LASER TURP (TRANSURETHRAL RESECTION OF PROSTATE) N/A 02/11/2017   Procedure: THULIUM LASER TURP (TRANSURETHRAL RESECTION OF PROSTATE);  Surgeon: Jethro BolusSigmund Tannenbaum, MD;  [facility];  Service: Urology;  Laterality: N/A;  . TONSILLECTOMY  age 89  . UMBILICAL HERNIA REPAIR  2006    Social History   Socioeconomic History  . Marital status: Married    Spouse name: Not on file  . Number of children: 5  . Years of education: Not on file  . Highest education level: Not on file  Occupational History  . Occupation: [employer]  Social Needs  . Financial resource strain: Not on file  . Food insecurity    Worry: Not on file    Inability: Not on file  . Transportation needs    Medical: Not on file    Non-medical: Not on file  Tobacco Use  . Smoking status: Never Smoker  . Smokeless tobacco: Never Used  Substance and Sexual Activity  . Alcohol use: Yes    Comment:  OCCASIONAL  . Drug use: No  . Sexual activity: Not on file    Comment: VASECTOMY  Lifestyle  . Physical activity    Days per week: Not on file    Minutes per session: Not on file  . Stress: Not on file  Relationships  . Social Musicianconnections    Talks on phone: Not on file    Gets together: Not on file    Attends religious service: Not on file    Active member of club or organization: Not on file    Attends meetings of clubs or organizations: Not on file    Relationship status: Not on file  . Intimate partner violence    Fear of current or ex partner: Not on file    Emotionally abused: Not on file    Physically abused: Not on file    Forced sexual activity: Not on file  Other Topics Concern  . Not on file  Social History Narrative   Lives w/ wife     Family History  Problem Relation Age of Onset  . Diabetes Mother   . Hypertension Mother   . Colon cancer Neg Hx   . Prostate cancer Neg Hx   . CAD Neg Hx   .  Stroke Neg Hx   . Esophageal cancer Neg Hx   . Rectal cancer Neg Hx      Allergies as of 05/15/2019   No Known Allergies     Medication List       Accurate as of May 15, 2019 11:59 PM. If you have any questions, ask your nurse or doctor.        STOP taking these medications   dorzolamide-timolol 22.3-6.8 MG/ML ophthalmic solution Commonly known as: COSOPT Stopped by: Kathlene November, MD     TAKE these medications   amLODipine 5 MG tablet Commonly known as: NORVASC Take 1 tablet (5 mg total) by mouth daily.   brimonidine 0.2 % ophthalmic solution Commonly known as: ALPHAGAN Place 1 drop into the left eye 3 (three) times daily.   dorzolamide 2 % ophthalmic solution Commonly known as: TRUSOPT Place 1 drop into both eyes 2 (two) times daily.   latanoprost 0.005 % ophthalmic solution Commonly known as: XALATAN Place 1 drop into both eyes at bedtime.   levothyroxine 88 MCG tablet Commonly known as: SYNTHROID Take 1 tablet (88 mcg total) by mouth daily  before breakfast.   Myrbetriq 50 MG Tb24 tablet Generic drug: mirabegron ER Take 1 tablet by mouth daily.           Objective:   Physical Exam BP 126/75 (BP Location: Left Arm, Patient Position: Sitting, Cuff Size: Small)   Pulse (!) 57   Temp 98.4 F (36.9 C) (Oral)   Resp 16   Ht 5\' 8"  (1.727 m)   Wt 198 lb 4 oz (89.9 kg)   SpO2 100%   BMI 30.14 kg/m  General: Well developed, NAD, BMI noted Neck: No  thyromegaly  HEENT:  Normocephalic . Face symmetric, atraumatic Lungs:  CTA B Normal respiratory effort, no intercostal retractions, no accessory muscle use. Heart: RRR,  no murmur.  No pretibial edema bilaterally  Abdomen:  Not distended, soft, non-tender. No rebound or rigidity.   Skin: Exposed areas without rash. Not pale. Not jaundice Neurologic:  alert & oriented X3.  Speech normal, gait appropriate for age and unassisted Strength symmetric and appropriate for age.  Psych: Cognition and judgment appear intact.  Cooperative with normal attention span and concentration.  Behavior appropriate. No anxious or depressed appearing.     Assessment     Assessment HTN started meds 2013 Migraine headaches Hypothyroidism: Started Synthroid 09-2018 OSA-- on Cpap Glaucoma BPH, elevated PSA , (-) prostate BX 2011, TURP 02-2017, Dr Gaynelle Arabian  PLAN: HTN: On amlodipine, BPs when he visits his ophthalmologist are normal.  No change, recommend ambulatory BPs Hypothyroidism: On Synthroid, check labs. BPH: Symptoms improve with Myrbetriq. RTC 1 year

## 2019-05-15 NOTE — Assessment & Plan Note (Addendum)
Tdap 2014  pnm 23 : 2017 Shingrix: pt requested shingrix, #1 provided  today , RTC 2 months  Early flu shot for 2020 encouraged CCS: scope 06/2018, no polyps, + unt hemorrhoids, 10 years PSA per urology  Labs:  cmp flp cbc a1c tsh Diet and exercise discussed, doing well

## 2019-05-15 NOTE — Patient Instructions (Signed)
Get the blood work     Daryl Adkins  Schedule your next Shingrix (vaccine ) to be done in 2 -3 months  Schedule your next appointment   physical exam in 1 year     Check the  blood pressure 2 monthly   GOAL  is between 110/65 and  135/85. If it is consistently higher or lower, let me know

## 2019-05-15 NOTE — Progress Notes (Signed)
Pre visit review using our clinic review tool, if applicable. No additional management support is needed unless otherwise documented below in the visit note. 

## 2019-05-17 NOTE — Assessment & Plan Note (Signed)
HTN: On amlodipine, BPs when he visits his ophthalmologist are normal.  No change, recommend ambulatory BPs Hypothyroidism: On Synthroid, check labs. BPH: Symptoms improve with Myrbetriq. RTC 1 year

## 2019-06-25 HISTORY — PX: TRABECULECTOMY: SHX107

## 2019-06-29 ENCOUNTER — Encounter: Payer: Self-pay | Admitting: Pulmonary Disease

## 2019-06-29 ENCOUNTER — Telehealth (INDEPENDENT_AMBULATORY_CARE_PROVIDER_SITE_OTHER): Payer: Managed Care, Other (non HMO) | Admitting: Pulmonary Disease

## 2019-06-29 DIAGNOSIS — G4733 Obstructive sleep apnea (adult) (pediatric): Secondary | ICD-10-CM

## 2019-06-29 DIAGNOSIS — Z9989 Dependence on other enabling machines and devices: Secondary | ICD-10-CM

## 2019-06-29 NOTE — Progress Notes (Signed)
[facility]  Chief Complaint  Patient presents with  . Sleep Apnea    Yearly Follow Up    Constitutional:  There were no vitals taken for this visit.  Deferred.  Past Medical History:  Glaucoma, Migraine HA, HTN, ED, Depression, BPH, Allergies  Brief Summary:  Daryl Adkins is a 58 y.o. male with obstructive sleep apnea. Virtual Visit via Video Note  I connected with Ruhaan W Havlik on 06/29/19 at  9:15 AM EDT by a video enabled telemedicine application and verified that I am speaking with the correct person using two identifiers.  Location: Patient: home Provider: medical office   I discussed the limitations of evaluation and management by telemedicine and the availability of in person appointments. The patient expressed understanding and agreed to proceed.  Had glaucoma surgery earlier this month.  Using eye drops.  No issues with CPAP.  Has nasal pillows mask.  Gets about 7 hrs sleep.  Not having sinus congestion, sore throat, dry mouth, or aerophagia.  Feels rested during the day.   Physical Exam:  Has bruising around Lt eye after recent glaucoma surgery.  Assessment/Plan:   Obstructive sleep apnea. - he is compliant with CPAP and reports benefit from therapy - continue CPAP 9 cm H2O   Patient Instructions  Follow up in 1 year  I discussed the assessment and treatment plan with the patient. The patient was provided an opportunity to ask questions and all were answered. The patient agreed with the plan and demonstrated an understanding of the instructions.   The patient was advised to call back or seek an in-person evaluation if the symptoms worsen or if the condition fails to improve as anticipated.  I provided 16 minutes of non-face-to-face time during this encounter.   Chesley Mires, MD   Chesley Mires, MD [place] Pager: [phone] 06/29/2019, 9:35 AM  Flow Sheet    Sleep tests:   PSG ([facility]) 09/13/07 >> AHI 9, SpO2 low 80% CPAP 07/25/16 to 08/27/16 >>used on 30 of 34 nights with average 6 hrs 9 min. Average AHI 1.3 with CPAP 9 cm H2O   Medications:   Allergies as of 06/29/2019   No Known Allergies     Medication List       Accurate as of June 29, 2019  9:35 AM. If you have any questions, ask your nurse or doctor.        amLODipine 5 MG tablet Commonly known as: NORVASC Take 1 tablet (5 mg total) by mouth daily.   brimonidine 0.2 % ophthalmic solution Commonly known as: ALPHAGAN Place 1 drop into the left eye 3 (three) times daily.   dorzolamide 2 % ophthalmic solution Commonly known as: TRUSOPT Place 1 drop into both eyes 2 (two) times daily.   latanoprost 0.005 % ophthalmic solution Commonly known as: XALATAN Place 1 drop into both eyes at bedtime.   levothyroxine 88 MCG tablet Commonly known as: SYNTHROID Take 1 tablet (88 mcg total) by mouth daily before breakfast.   Myrbetriq 50 MG Tb24 tablet Generic drug: mirabegron ER Take 1 tablet by mouth daily.       Past Surgical History:  He  has a past surgical history that includes Glaucoma surgery (2013, 2020); Prostate biopsy (12-2009); Umbilical hernia repair (2006); Inguinal hernia repair (Bilateral, 2002); Tonsillectomy (age 48); and Thulium laser TURP (transurethral resection of prostate) (N/A, 02/11/2017).  Family History:  His family history includes Diabetes in his mother; Hypertension  in his mother.  Social History:  He  reports that he has never smoked. He has never used smokeless tobacco. He reports current alcohol use. He reports that he does not use drugs.

## 2019-06-29 NOTE — Patient Instructions (Signed)
Follow up in 1 year.

## 2019-07-31 ENCOUNTER — Ambulatory Visit (INDEPENDENT_AMBULATORY_CARE_PROVIDER_SITE_OTHER): Payer: Managed Care, Other (non HMO) | Admitting: *Deleted

## 2019-07-31 ENCOUNTER — Other Ambulatory Visit: Payer: Self-pay

## 2019-07-31 DIAGNOSIS — Z23 Encounter for immunization: Secondary | ICD-10-CM

## 2019-07-31 NOTE — Progress Notes (Signed)
Patient here for flu shot and shingles vaccine.  Vaccines given and patient tolerated well.  Flu vaccine given in left deltoid and shingles vaccine given in right deltoid.

## 2019-09-20 ENCOUNTER — Other Ambulatory Visit: Payer: Self-pay | Admitting: Internal Medicine

## 2019-11-02 ENCOUNTER — Other Ambulatory Visit: Payer: Self-pay | Admitting: Internal Medicine

## 2020-05-06 ENCOUNTER — Other Ambulatory Visit: Payer: Self-pay | Admitting: Internal Medicine

## 2020-05-20 ENCOUNTER — Ambulatory Visit (INDEPENDENT_AMBULATORY_CARE_PROVIDER_SITE_OTHER): Payer: Managed Care, Other (non HMO) | Admitting: Internal Medicine

## 2020-05-20 ENCOUNTER — Encounter: Payer: Self-pay | Admitting: Internal Medicine

## 2020-05-20 ENCOUNTER — Other Ambulatory Visit: Payer: Self-pay

## 2020-05-20 VITALS — BP 126/83 | HR 55 | Temp 98.6°F | Resp 18 | Ht 68.0 in | Wt 193.2 lb

## 2020-05-20 DIAGNOSIS — Z0000 Encounter for general adult medical examination without abnormal findings: Secondary | ICD-10-CM | POA: Diagnosis not present

## 2020-05-20 DIAGNOSIS — E039 Hypothyroidism, unspecified: Secondary | ICD-10-CM | POA: Diagnosis not present

## 2020-05-20 LAB — COMPREHENSIVE METABOLIC PANEL
ALT: 16 U/L (ref 0–53)
AST: 16 U/L (ref 0–37)
Albumin: 4.4 g/dL (ref 3.5–5.2)
Alkaline Phosphatase: 117 U/L (ref 39–117)
BUN: 13 mg/dL (ref 6–23)
CO2: 26 mEq/L (ref 19–32)
Calcium: 9.3 mg/dL (ref 8.4–10.5)
Chloride: 104 mEq/L (ref 96–112)
Creatinine, Ser: 1.05 mg/dL (ref 0.40–1.50)
GFR: 87.1 mL/min (ref >60.00)
Glucose, Bld: 98 mg/dL (ref 70–99)
Potassium: 3.5 mEq/L (ref 3.5–5.1)
Sodium: 139 mEq/L (ref 135–145)
Total Bilirubin: 0.4 mg/dL (ref 0.2–1.2)
Total Protein: 7.7 g/dL (ref 6.0–8.3)

## 2020-05-20 LAB — CBC WITH DIFFERENTIAL/PLATELET
Basophils Absolute: 0.1 10*3/uL (ref 0.0–0.1)
Basophils Relative: 0.7 % (ref 0.0–3.0)
Eosinophils Absolute: 0.2 10*3/uL (ref 0.0–0.7)
Eosinophils Relative: 2.2 % (ref 0.0–5.0)
HCT: 45.3 % (ref 39.0–52.0)
Hemoglobin: 15.3 g/dL (ref 13.0–17.0)
Lymphocytes Relative: 30.1 % (ref 12.0–46.0)
Lymphs Abs: 2.6 10*3/uL (ref 0.7–4.0)
MCHC: 33.7 g/dL (ref 30.0–36.0)
MCV: 89.6 fl (ref 78.0–100.0)
Monocytes Absolute: 0.8 10*3/uL (ref 0.1–1.0)
Monocytes Relative: 9.2 % (ref 3.0–12.0)
Neutro Abs: 4.9 10*3/uL (ref 1.4–7.7)
Neutrophils Relative %: 57.8 % (ref 43.0–77.0)
Platelets: 238 10*3/uL (ref 150.0–400.0)
RBC: 5.05 Mil/uL (ref 4.22–5.81)
RDW: 13.9 % (ref 11.5–15.5)
WBC: 8.5 10*3/uL (ref 4.0–10.5)

## 2020-05-20 LAB — LIPID PANEL
Cholesterol: 126 mg/dL (ref 0–200)
HDL: 32.6 mg/dL — ABNORMAL LOW (ref >39.00)
NonHDL: 93.47
Total CHOL/HDL Ratio: 4
Triglycerides: 263 mg/dL — ABNORMAL HIGH (ref 0.0–149.0)
VLDL: 52.6 mg/dL — ABNORMAL HIGH (ref 0.0–40.0)

## 2020-05-20 LAB — LDL CHOLESTEROL, DIRECT: Direct LDL: 56 mg/dL

## 2020-05-20 LAB — TSH: TSH: 4.04 u[IU]/mL (ref 0.35–4.50)

## 2020-05-20 LAB — HEMOGLOBIN A1C: Hgb A1c MFr Bld: 5.5 % (ref 4.6–6.5)

## 2020-05-20 NOTE — Patient Instructions (Addendum)
Check the  blood pressure monthly  BP GOAL is between 110/65 and  135/85. If it is consistently higher or lower, let me know  Get your flu shot yearly  Keep your appointment with ophthalmology. If you notice any major changes in the area reach out to them also if you have pain, swelling, visual disturbances.  GO TO THE LAB : Get the blood work     GO TO THE FRONT DESK, PLEASE SCHEDULE YOUR APPOINTMENTS Come back for a physical exam in 1 year

## 2020-05-20 NOTE — Progress Notes (Signed)
Subjective:    Patient ID: Daryl Adkins, male    DOB: 1960/12/22, 59 y.o.   MRN: [id number]  DOS:  05/20/2020 Type of visit - description: CPX In general feels well. His only concern is that yesterday noted irregularity at the left sclera where he had surgery.  Pain, no visual disturbances   Review of Systems  Other than above, a 14 point review of systems is negative     Past Medical History:  Diagnosis Date  . Allergy   . Benign localized prostatic hyperplasia with lower urinary tract symptoms (LUTS)   . Complication of anesthesia    HARD TO WAKE  . Depression   . Elevated PSA   . Erectile dysfunction   . Feeling of incomplete bladder emptying   . Hypertension   . Migraine headache   . Open-angle glaucoma of both eyes   . OSA on CPAP followed by dr Craige Cotta   per study 09-13-2087 mild osa  . Sleep apnea   . Wears glasses     Past Surgical History:  Procedure Laterality Date  . GLAUCOMA SURGERY  2013, 2020   at [facility]  . INGUINAL HERNIA REPAIR Bilateral 2002  . PROSTATE BIOPSY  12-2009   no cancer   . THULIUM LASER TURP (TRANSURETHRAL RESECTION OF PROSTATE) N/A 02/11/2017   Procedure: THULIUM LASER TURP (TRANSURETHRAL RESECTION OF PROSTATE);  Surgeon: Jethro Bolus, MD;  Location: [facility];  Service: Urology;  Laterality: N/A;  . TONSILLECTOMY  age 45  . TRABECULECTOMY  06/25/2019  . UMBILICAL HERNIA REPAIR  2006    Allergies as of 05/20/2020   No Known Allergies     Medication List       Accurate as of May 20, 2020 11:59 PM. If you have any questions, ask your nurse or doctor.        STOP taking these medications   brimonidine 0.2 % ophthalmic solution Commonly known as: ALPHAGAN Stopped by: Willow Ora, MD     TAKE these medications   amLODipine 5 MG tablet Commonly known as: NORVASC Take 1 tablet (5 mg total) by mouth daily.   dorzolamide 2 % ophthalmic solution Commonly known as: TRUSOPT Place 1 drop into both eyes 2 (two)  times daily.   latanoprost 0.005 % ophthalmic solution Commonly known as: XALATAN Place 1 drop into both eyes at bedtime.   levothyroxine 88 MCG tablet Commonly known as: SYNTHROID TAKE 1 TABLET(88 MCG) BY MOUTH DAILY BEFORE BREAKFAST   Myrbetriq 50 MG Tb24 tablet Generic drug: mirabegron ER Take 1 tablet by mouth daily.          Objective:   Physical Exam Eyes:     BP 126/83 (BP Location: Left Arm, Patient Position: Sitting, Cuff Size: Normal)   Pulse (!) 55   Temp 98.6 F (37 C) (Oral)   Resp 18   Ht 5\' 8"  (1.727 m)   Wt 193 lb 4 oz (87.7 kg)   SpO2 98%   BMI 29.38 kg/m   General: Well developed, NAD, BMI noted Neck: No  thyromegaly  HEENT:  Normocephalic . Face symmetric, atraumatic Lungs:  CTA B Normal respiratory effort, no intercostal retractions, no accessory muscle use. Heart: RRR,  no murmur.  Abdomen:  Not distended, soft, non-tender. No rebound or rigidity.   Lower extremities: no pretibial edema bilaterally  Skin: Exposed areas without rash. Not pale. Not jaundice Neurologic:  alert & oriented X3.  Speech normal, gait appropriate for age and unassisted  Strength symmetric and appropriate for age.  Psych: Cognition and judgment appear intact.  Cooperative with normal attention span and concentration.  Behavior appropriate. No anxious or depressed appearing.     Assessment    Assessment HTN started meds 2013 Migraine headaches Hypothyroidism: Started Synthroid 09-2018 OSA-- on Cpap Glaucoma BPH, elevated PSA , (-) prostate BX 2011, TURP 02-2017, Dr Patsi Sears  PLAN: Here for CPX HTN: Continue amlodipine. Hypothyroidism: On Synthroid, checking labs today, consider recheck TSH before next physical Scleral cyst?  He had surgery there not long ago, defect was noted by the patient a day or 2 ago, no pain, no discharge, visual fields normal.  I reach out to ophthalmology, spoke with the technician, this is a normal result of the previous  surgery.  He has an appointment in the next couple of weeks.  I asked the patient to monitor the area and if there is any problems to call ophthalmology. RTC 1 year   This visit occurred during the SARS-CoV-2 public health emergency.  Safety protocols were in place, including screening questions prior to the visit, additional usage of staff PPE, and extensive cleaning of exam room while observing appropriate contact time as indicated for disinfecting solutions.

## 2020-05-20 NOTE — Progress Notes (Signed)
Pre visit review using our clinic review tool, if applicable. No additional management support is needed unless otherwise documented below in the visit note. 

## 2020-05-21 ENCOUNTER — Encounter: Payer: Self-pay | Admitting: Internal Medicine

## 2020-05-21 NOTE — Assessment & Plan Note (Signed)
Tdap 2014  pnm 23 : 2017 S/p Shingrix  S/p covid shots  Rec flu shot q year CCS: scope 06/2018, no polyps, + unt hemorrhoids, 10 years PSA per urology  Labs:  CMP, FLP, CBC, A1c, TSH, Diet and exercise discussed.

## 2020-05-21 NOTE — Assessment & Plan Note (Signed)
Here for CPX HTN: Continue amlodipine. Hypothyroidism: On Synthroid, checking labs today, consider recheck TSH before next physical Scleral cyst?  He had surgery there not long ago, defect was noted by the patient a day or 2 ago, no pain, no discharge, visual fields normal.  I reach out to ophthalmology, spoke with the technician, this is a normal result of the previous surgery.  He has an appointment in the next couple of weeks.  I asked the patient to monitor the area and if there is any problems to call ophthalmology. RTC 1 year

## 2020-05-26 ENCOUNTER — Other Ambulatory Visit: Payer: Self-pay | Admitting: Internal Medicine

## 2020-05-26 MED ORDER — LEVOTHYROXINE SODIUM 100 MCG PO TABS
100.0000 ug | ORAL_TABLET | Freq: Every day | ORAL | 1 refills | Status: DC
Start: 2020-05-26 — End: 2020-07-29

## 2020-05-26 NOTE — Addendum Note (Signed)
Addended byConrad [place] D on: 05/26/2020 09:02 AM   Modules accepted: Orders

## 2020-07-27 ENCOUNTER — Other Ambulatory Visit (INDEPENDENT_AMBULATORY_CARE_PROVIDER_SITE_OTHER): Payer: Managed Care, Other (non HMO)

## 2020-07-27 ENCOUNTER — Other Ambulatory Visit: Payer: Self-pay

## 2020-07-27 DIAGNOSIS — E039 Hypothyroidism, unspecified: Secondary | ICD-10-CM | POA: Diagnosis not present

## 2020-07-27 NOTE — Addendum Note (Signed)
Addended by: Mervin Kung A on: 07/27/2020 07:57 AM   Modules accepted: Orders

## 2020-07-28 LAB — TSH: TSH: 1.72 mIU/L (ref 0.40–4.50)

## 2020-07-29 ENCOUNTER — Other Ambulatory Visit: Payer: Self-pay | Admitting: Internal Medicine

## 2020-08-09 ENCOUNTER — Encounter (HOSPITAL_BASED_OUTPATIENT_CLINIC_OR_DEPARTMENT_OTHER): Payer: Self-pay | Admitting: *Deleted

## 2020-08-09 ENCOUNTER — Other Ambulatory Visit: Payer: Self-pay

## 2020-08-09 ENCOUNTER — Emergency Department (HOSPITAL_BASED_OUTPATIENT_CLINIC_OR_DEPARTMENT_OTHER): Payer: Managed Care, Other (non HMO)

## 2020-08-09 ENCOUNTER — Emergency Department (HOSPITAL_BASED_OUTPATIENT_CLINIC_OR_DEPARTMENT_OTHER)
Admission: EM | Admit: 2020-08-09 | Discharge: 2020-08-09 | Disposition: A | Discharge status: Home / Self Care | Payer: Managed Care, Other (non HMO) | Attending: Emergency Medicine | Admitting: Emergency Medicine

## 2020-08-09 DIAGNOSIS — Y93E1 Activity, personal bathing and showering: Secondary | ICD-10-CM | POA: Insufficient documentation

## 2020-08-09 DIAGNOSIS — Z79899 Other long term (current) drug therapy: Secondary | ICD-10-CM | POA: Insufficient documentation

## 2020-08-09 DIAGNOSIS — S20212A Contusion of left front wall of thorax, initial encounter: Secondary | ICD-10-CM | POA: Insufficient documentation

## 2020-08-09 DIAGNOSIS — I10 Essential (primary) hypertension: Secondary | ICD-10-CM | POA: Diagnosis not present

## 2020-08-09 DIAGNOSIS — W182XXA Fall in (into) shower or empty bathtub, initial encounter: Secondary | ICD-10-CM | POA: Diagnosis not present

## 2020-08-09 DIAGNOSIS — Y92002 Bathroom of unspecified non-institutional (private) residence single-family (private) house as the place of occurrence of the external cause: Secondary | ICD-10-CM | POA: Diagnosis not present

## 2020-08-09 DIAGNOSIS — S299XXA Unspecified injury of thorax, initial encounter: Secondary | ICD-10-CM | POA: Diagnosis present

## 2020-08-09 MED ORDER — OXYCODONE-ACETAMINOPHEN 5-325 MG PO TABS
1.0000 | ORAL_TABLET | Freq: Once | ORAL | Status: AC
  Administered 2020-08-09: 1 via ORAL
  Filled 2020-08-09: qty 1

## 2020-08-09 MED ORDER — OXYCODONE-ACETAMINOPHEN 5-325 MG PO TABS: 1.0000 | ORAL_TABLET | Freq: Four times a day (QID) | ORAL | 0 refills | Status: DC | PRN

## 2020-08-09 NOTE — ED Notes (Signed)
Pt. Up walking to restroom at this time.

## 2020-08-09 NOTE — Discharge Instructions (Signed)
Please read instructions below. Apply ice to your ribs for 20 minutes at a time. You can take oxycodone every 6 hours as needed for severe pain. Be aware this medication can make you drowsy. Do not operate motor vehicles or drink alcohol while taking this medication. (There is tylenol in this medication) Schedule an appointment with your primary care provider follow-up on your injury. Use the incentive spirometer (breathing apparatus) frequently throughout the day to ensure your lungs stay healthy while you heal. Return to the ER for shortness of breath, uncontrollable pain or concerning symptoms.

## 2020-08-09 NOTE — ED Triage Notes (Signed)
He slipped in the shower. Injury to his left ribs.

## 2020-08-09 NOTE — ED Provider Notes (Signed)
[facility] EMERGENCY DEPARTMENT Provider Note   CSN: [id number] Arrival date & time: 08/09/20  1459     History Chief Complaint  Patient presents with  . Fall  . Rib Injury    Daryl Adkins is a 59 y.o. male with past medical history of hypertension, presenting to the emergency department with complaint of left rib pain after mechanical fall in the shower.  This happened today around 2 PM, he slipped and fell in the shower, landing on his left ribs on the edge of the tub.  Patient not hit his head or lose consciousness.  He is complaining of pain and swelling to the left lower ribs.  Denies associated shortness of breath or pain with breathing, no abdominal pain.  Not on anticoagulation.  He has not treated his symptoms with any medications.  Presenting to the ED due to worsening pain, exacerbated by movement.  The history is provided by the patient.       Past Medical History:  Diagnosis Date  . Allergy   . Benign localized prostatic hyperplasia with lower urinary tract symptoms (LUTS)   . Complication of anesthesia    HARD TO WAKE  . Depression   . Elevated PSA   . Erectile dysfunction   . Feeling of incomplete bladder emptying   . Hypertension   . Migraine headache   . Open-angle glaucoma of both eyes   . OSA on CPAP followed by dr Craige Cotta   per study 09-13-2087 mild osa  . Sleep apnea   . Wears glasses     Patient Active Problem List   Diagnosis Date Noted  . PCP NOTES >>>>>>>>>>>>>>>>>>>>>>>>>> 01/17/2016  . Abnormal thyroid function test 11/09/2013  . Annual physical exam 04/06/2013  . Hypertension   . Glaucoma   . Nuclear sclerotic cataract 08/26/2012  . Chronic glaucoma 08/26/2012  . OSA (obstructive sleep apnea) 05/14/2012  . PSA, INCREASED 10/13/2009  . BPH (benign prostatic hyperplasia) 05/30/2009  . MIGRAINE 04/11/2007    Past Surgical History:  Procedure Laterality Date  . GLAUCOMA SURGERY  2013, 2020   at [facility]  . INGUINAL HERNIA  REPAIR Bilateral 2002  . PROSTATE BIOPSY  12-2009   no cancer   . THULIUM LASER TURP (TRANSURETHRAL RESECTION OF PROSTATE) N/A 02/11/2017   Procedure: THULIUM LASER TURP (TRANSURETHRAL RESECTION OF PROSTATE);  Surgeon: Jethro Bolus, MD;  Location: [facility];  Service: Urology;  Laterality: N/A;  . TONSILLECTOMY  age 67  . TRABECULECTOMY  06/25/2019  . UMBILICAL HERNIA REPAIR  2006       Family History  Problem Relation Age of Onset  . Diabetes Mother   . Hypertension Mother   . Colon cancer Neg Hx   . Prostate cancer Neg Hx   . CAD Neg Hx   . Stroke Neg Hx   . Esophageal cancer Neg Hx   . Rectal cancer Neg Hx     Social History   Tobacco Use  . Smoking status: Never Smoker  . Smokeless tobacco: Never Used  Vaping Use  . Vaping Use: Never used  Substance Use Topics  . Alcohol use: Yes    Comment: OCCASIONAL  . Drug use: No    Home Medications Prior to Admission medications   Medication Sig Start Date End Date Taking? Authorizing Provider  amLODipine (NORVASC) 5 MG tablet Take 1 tablet (5 mg total) by mouth daily. 09/21/19   [name], MD  dorzolamide (TRUSOPT) 2 % ophthalmic solution  Place 1 drop into both eyes 2 (two) times daily.    [provider]  latanoprost (XALATAN) 0.005 % ophthalmic solution Place 1 drop into both eyes at bedtime.  10/15/13   [provider]  levothyroxine (SYNTHROID) 100 MCG tablet Take 1 tablet (100 mcg total) by mouth daily before breakfast. 07/29/20   [name], MD  mirabegron ER (MYRBETRIQ) 50 MG TB24 tablet Take 1 tablet by mouth daily. 02/17/19   [provider]  oxyCODONE-acetaminophen (PERCOCET/ROXICET) 5-325 MG tablet Take 1-2 tablets by mouth every 6 (six) hours as needed for severe pain. 08/09/20   Kairav Russomanno, [place] N, PA-C    Allergies    Patient has no known allergies.  Review of Systems   Review of Systems  Respiratory: Negative for shortness of breath.   All other systems  reviewed and are negative.   Physical Exam Updated Vital Signs BP (!) 147/89 (BP Location: Left Arm)   Pulse (!) 55   Temp 98.6 F (37 C) (Oral)   Resp 16   Ht 5\' 8"  (1.727 m)   Wt 86.2 kg   SpO2 99%   BMI 28.89 kg/m   Physical Exam Vitals and nursing note reviewed.  Constitutional:      Appearance: He is well-developed.  HENT:     Head: Normocephalic and atraumatic.  Eyes:     Conjunctiva/sclera: Conjunctivae normal.  Cardiovascular:     Rate and Rhythm: Normal rate and regular rhythm.  Pulmonary:     Effort: Pulmonary effort is normal. No respiratory distress.     Breath sounds: Normal breath sounds.     Comments: TTP to left lower lateral ribs. No deformity or bruising. No flail chest or crepitus.  Symmetric chest expansion. Abdominal:     General: Bowel sounds are normal.     Palpations: Abdomen is soft.     Tenderness: There is no abdominal tenderness. There is no guarding or rebound.     Comments: No bruising or TTP to abdomen or flank. No CVA TTP  Skin:    General: Skin is warm.  Neurological:     Mental Status: He is alert.  Psychiatric:        Behavior: Behavior normal.     ED Results / Procedures / Treatments   Labs (all labs ordered are listed, but only abnormal results are displayed) Labs Reviewed - No data to display  EKG None  Radiology DG Ribs Unilateral W/Chest Left  Result Date: 08/09/2020 CLINICAL DATA:  Fall, left lateral rib pain EXAM: LEFT RIBS AND CHEST - 3+ VIEW COMPARISON:  Chest x-ray 11/14/2008 FINDINGS: Metallic BB marker noted at the level of the inferior left ribs. No fracture or other bone lesions are seen involving the ribs. There is no evidence of pneumothorax or pleural effusion. Both lungs are clear. Heart size and mediastinal contours are within normal limits. Degenerative changes of the spine. IMPRESSION: 1. No acute displaced left rib fracture. 2. No acute cardiopulmonary abnormality. Electronically Signed   By: 01/12/2009 M.D.   On: 08/09/2020 15:51    Procedures Procedures (including critical care time)  Medications Ordered in ED Medications  oxyCODONE-acetaminophen (PERCOCET/ROXICET) 5-325 MG per tablet 1 tablet (1 tablet Oral Given 08/09/20 1724)    ED Course  I have reviewed the triage vital signs and the nursing notes.  Pertinent labs & imaging results that were available during my care of the patient were reviewed by me and considered in my medical decision making (see  chart for details).    MDM Rules/Calculators/A&P                          Pt presenting to the ED with complaint of left rib injury that occurred today after slipping and falling in the shower. Not on anticoagulation. NO head trauma or other injuries. He is not complaining of SOB or pain with breathing, only has pain with movement.  No abdominal pain or tenderness.  Lung sounds are clear and equal.  He appears uncomfortable though is in no respiratory distress.  O2 sat is excellent on RA. Chest x-ray is negative for pneumothorax or obvious rib fracture.  Had shared decision making with patient regarding advanced imaging, patient would prefer to treat symptomatically and follow-up outpatient.  Pain treated here in the ED.  Will also provide incentive spirometer.  Prescribed pain medication, recommend ice, incentive spirometer, and close outpatient follow-up.  Is instructed of strict return precautions and verbalized understanding, agrees with care plan at this time.  [place] Controlled Substance reporting System queried  Discussed results, findings, treatment and follow up. Patient advised of return precautions. Patient verbalized understanding and agreed with plan.  Final Clinical Impression(s) / ED Diagnoses Final diagnoses:  Rib contusion, left, initial encounter    Rx / DC Orders ED Discharge Orders         Ordered    oxyCODONE-acetaminophen (PERCOCET/ROXICET) 5-325 MG tablet  Every 6 hours PRN        08/09/20  1738           Kadia Abaya, [place] N, PA-C 08/09/20 1741    Vanetta Mulders, MD 08/12/20 1545

## 2020-09-14 ENCOUNTER — Other Ambulatory Visit: Payer: Self-pay | Admitting: Internal Medicine

## 2021-05-26 ENCOUNTER — Encounter: Payer: Self-pay | Admitting: Internal Medicine

## 2021-05-26 ENCOUNTER — Ambulatory Visit (INDEPENDENT_AMBULATORY_CARE_PROVIDER_SITE_OTHER): Payer: Managed Care, Other (non HMO) | Admitting: Internal Medicine

## 2021-05-26 ENCOUNTER — Other Ambulatory Visit: Payer: Self-pay

## 2021-05-26 VITALS — BP 126/76 | HR 57 | Temp 98.2°F | Resp 16 | Ht 68.0 in | Wt 192.0 lb

## 2021-05-26 DIAGNOSIS — I10 Essential (primary) hypertension: Secondary | ICD-10-CM

## 2021-05-26 DIAGNOSIS — R972 Elevated prostate specific antigen [PSA]: Secondary | ICD-10-CM | POA: Diagnosis not present

## 2021-05-26 DIAGNOSIS — Z0000 Encounter for general adult medical examination without abnormal findings: Secondary | ICD-10-CM

## 2021-05-26 DIAGNOSIS — R739 Hyperglycemia, unspecified: Secondary | ICD-10-CM | POA: Diagnosis not present

## 2021-05-26 LAB — CBC WITH DIFFERENTIAL/PLATELET
Basophils Absolute: 0.1 10*3/uL (ref 0.0–0.1)
Basophils Relative: 0.8 % (ref 0.0–3.0)
Eosinophils Absolute: 0.1 10*3/uL (ref 0.0–0.7)
Eosinophils Relative: 1.4 % (ref 0.0–5.0)
HCT: 43.2 % (ref 39.0–52.0)
Hemoglobin: 14.2 g/dL (ref 13.0–17.0)
Lymphocytes Relative: 29.1 % (ref 12.0–46.0)
Lymphs Abs: 1.9 10*3/uL (ref 0.7–4.0)
MCHC: 32.9 g/dL (ref 30.0–36.0)
MCV: 89.9 fl (ref 78.0–100.0)
Monocytes Absolute: 0.6 10*3/uL (ref 0.1–1.0)
Monocytes Relative: 9.6 % (ref 3.0–12.0)
Neutro Abs: 3.8 10*3/uL (ref 1.4–7.7)
Neutrophils Relative %: 59.1 % (ref 43.0–77.0)
Platelets: 230 10*3/uL (ref 150.0–400.0)
RBC: 4.81 Mil/uL (ref 4.22–5.81)
RDW: 13.5 % (ref 11.5–15.5)
WBC: 6.5 10*3/uL (ref 4.0–10.5)

## 2021-05-26 LAB — COMPREHENSIVE METABOLIC PANEL
ALT: 17 U/L (ref 0–53)
AST: 17 U/L (ref 0–37)
Albumin: 4.3 g/dL (ref 3.5–5.2)
Alkaline Phosphatase: 100 U/L (ref 39–117)
BUN: 16 mg/dL (ref 6–23)
CO2: 27 mEq/L (ref 19–32)
Calcium: 9 mg/dL (ref 8.4–10.5)
Chloride: 106 mEq/L (ref 96–112)
Creatinine, Ser: 0.94 mg/dL (ref 0.40–1.50)
GFR: 87.64 mL/min (ref >60.00)
Glucose, Bld: 86 mg/dL (ref 70–99)
Potassium: 3.7 mEq/L (ref 3.5–5.1)
Sodium: 141 mEq/L (ref 135–145)
Total Bilirubin: 0.6 mg/dL (ref 0.2–1.2)
Total Protein: 7.3 g/dL (ref 6.0–8.3)

## 2021-05-26 LAB — LIPID PANEL
Cholesterol: 134 mg/dL (ref 0–200)
HDL: 39.4 mg/dL (ref >39.00)
LDL Cholesterol: 73 mg/dL (ref 0–99)
NonHDL: 94.53
Total CHOL/HDL Ratio: 3
Triglycerides: 110 mg/dL (ref 0.0–149.0)
VLDL: 22 mg/dL (ref 0.0–40.0)

## 2021-05-26 LAB — HEMOGLOBIN A1C: Hgb A1c MFr Bld: 5.4 % (ref 4.6–6.5)

## 2021-05-26 LAB — TSH: TSH: 2.16 u[IU]/mL (ref 0.35–5.50)

## 2021-05-26 NOTE — Progress Notes (Signed)
Subjective:    Patient ID: Daryl Adkins, male    DOB: 11/27/1960, 60 y.o.   MRN: [id number]  DOS:  05/26/2021 Type of visit - description: CPX  Since last office visit is doing well. Has actually no concerns  Review of Systems  A 14 point review of systems is negative    Past Medical History:  Diagnosis Date   Allergy    Benign localized prostatic hyperplasia with lower urinary tract symptoms (LUTS)    Complication of anesthesia    HARD TO WAKE   Depression    Elevated PSA    Erectile dysfunction    Feeling of incomplete bladder emptying    Hypertension    Migraine headache    Open-angle glaucoma of both eyes    OSA on CPAP followed by dr Craige Cotta   per study 09-13-2087 mild osa   Sleep apnea    Wears glasses     Past Surgical History:  Procedure Laterality Date   GLAUCOMA SURGERY  2013, 2020   at [facility] HERNIA REPAIR Bilateral 2002   PROSTATE BIOPSY  12-2009   no cancer    THULIUM LASER TURP (TRANSURETHRAL RESECTION OF PROSTATE) N/A 02/11/2017   Procedure: THULIUM LASER TURP (TRANSURETHRAL RESECTION OF PROSTATE);  Surgeon: Jethro Bolus, MD;  Location: [facility];  Service: Urology;  Laterality: N/A;   TONSILLECTOMY  age 24   TRABECULECTOMY  06/25/2019   UMBILICAL HERNIA REPAIR  2006   Social History   Socioeconomic History   Marital status: Married    Spouse name: Not on file   Number of children: 5   Years of education: Not on file   Highest education level: Not on file  Occupational History   Occupation: [employer]  Tobacco Use   Smoking status: Never   Smokeless tobacco: Never  Vaping Use   Vaping Use: Never used  Substance and Sexual Activity   Alcohol use: Yes    Comment: OCCASIONAL   Drug use: No   Sexual activity: Not on file    Comment: VASECTOMY  Other Topics Concern   Not on file  Social History Narrative   Lives w/ wife   Social Determinants of Health   Financial Resource Strain: Not on file  Food  Insecurity: Not on file  Transportation Needs: Not on file  Physical Activity: Not on file  Stress: Not on file  Social Connections: Not on file  Intimate Partner Violence: Not on file     Allergies as of 05/26/2021   No Known Allergies      Medication List        Accurate as of May 26, 2021 11:59 PM. If you have any questions, ask your nurse or doctor.          STOP taking these medications    mirabegron ER 50 MG Tb24 tablet Commonly known as: MYRBETRIQ Stopped by: Willow Ora, MD   oxyCODONE-acetaminophen 5-325 MG tablet Commonly known as: PERCOCET/ROXICET Stopped by: Willow Ora, MD       TAKE these medications    amLODipine 5 MG tablet Commonly known as: NORVASC Take 1 tablet (5 mg total) by mouth daily.   dorzolamide 2 % ophthalmic solution Commonly known as: TRUSOPT Place 1 drop into both eyes 2 (two) times daily.   latanoprost 0.005 % ophthalmic solution Commonly known as: XALATAN Place 1 drop into both eyes at bedtime.   levothyroxine 100 MCG tablet Commonly known as: SYNTHROID Take 1 tablet (  100 mcg total) by mouth daily before breakfast.           Objective:   Physical Exam BP 126/76 (BP Location: Left Arm, Patient Position: Sitting, Cuff Size: Normal)   Pulse (!) 57   Temp 98.2 F (36.8 C) (Oral)   Resp 16   Ht 5\' 8"  (1.727 m)   Wt 192 lb (87.1 kg)   SpO2 97%   BMI 29.19 kg/m  General: Well developed, NAD, BMI noted Neck: No  thyromegaly  HEENT:  Normocephalic . Face symmetric, atraumatic Lungs:  CTA B Normal respiratory effort, no intercostal retractions, no accessory muscle use. Heart: RRR,  no murmur.  Abdomen:  Not distended, soft, non-tender. No rebound or rigidity.   Lower extremities: no pretibial edema bilaterally  Skin: Exposed areas without rash. Not pale. Not jaundice Neurologic:  alert & oriented X3.  Speech normal, gait appropriate for age and unassisted Strength symmetric and appropriate for age.   Psych: Cognition and judgment appear intact.  Cooperative with normal attention span and concentration.  Behavior appropriate. No anxious or depressed appearing.     Assessment     Assessment HTN started meds 2013 Migraine headaches Hypothyroidism: Started Synthroid 09-2018 OSA-- on Cpap Glaucoma BPH, elevated PSA , (-) prostate BX 2011, TURP 02-2017, Dr 09-08-1981   PLAN: Here for CPX HTN: Ambulatory BP in the 120s, very rarely gets in the 140s or 150s. Recommend to continue amlodipine, monitor BPs, if they are consistently above goal let me know. Hypothyroidism: Check TSH. OSA: Good CPAP compliance Glaucoma: Follow-up by ophthalmology RTC 1 year      This visit occurred during the SARS-CoV-2 public health emergency.  Safety protocols were in place, including screening questions prior to the visit, additional usage of staff PPE, and extensive cleaning of exam room while observing appropriate contact time as indicated for disinfecting solutions.

## 2021-05-26 NOTE — Patient Instructions (Addendum)
Check the  blood pressure regulalrly BP GOAL is between 110/65 and  135/85. If it is consistently higher or lower, let me know     GO TO THE LAB : Get the blood work     GO TO THE FRONT DESK, PLEASE SCHEDULE YOUR APPOINTMENTS Come back for  a physical exam  in 1 year      "Living will", "Health Care Power of attorney": Advanced care planning  (If you already have a living will or healthcare power of attorney, please bring the copy to be scanned in your chart.)  Advance care planning is a process that supports adults in  understanding and sharing their preferences regarding future medical care.   The patient's preferences are recorded in documents called Advance Directives.    Advanced directives are completed (and can be modified at any time) while the patient is in full mental capacity.   The documentation should be available at all times to the patient, the family and the healthcare providers.  Bring in a copy to be scanned in your chart is an excellent idea and is recommended   This legal documents direct treatment decision making and/or appoint a surrogate to make the decision if the patient is not capable to do so.    Advance directives can be documented in many types of formats,  documents have names such as:  Lliving will  Durable power of attorney for healthcare (healthcare proxy or healthcare power of attorney)  Combined directives  Physician orders for life-sustaining treatment    More information at:  [email or web address]

## 2021-05-27 ENCOUNTER — Encounter: Payer: Self-pay | Admitting: Internal Medicine

## 2021-05-27 NOTE — Assessment & Plan Note (Signed)
Here for CPX HTN: Ambulatory BP in the 120s, very rarely gets in the 140s or 150s. Recommend to continue amlodipine, monitor BPs, if they are consistently above goal let me know. Hypothyroidism: Check TSH. OSA: Good CPAP compliance Glaucoma: Follow-up by ophthalmology RTC 1 year

## 2021-05-27 NOTE — Assessment & Plan Note (Signed)
Tdap 2014 pnm 23 : 2017 S/p Shingrix S/p covid shots x4 Rec flu shot q year CCS: scope 06/2018, no polyps, + int hemorrhoids, 10 years PSA per urology, has not been seen lately, asymptomatic.  Referral sent. Labs: CMP FLP CBC TSH a1c Diet discussed.  Does not exercise regularly, encouraged to do. POA: Discussed

## 2021-09-09 ENCOUNTER — Other Ambulatory Visit: Payer: Self-pay | Admitting: Internal Medicine

## 2021-09-20 ENCOUNTER — Other Ambulatory Visit: Payer: Self-pay | Admitting: Internal Medicine

## 2021-10-12 LAB — PSA: PSA: 15.2

## 2021-10-18 ENCOUNTER — Other Ambulatory Visit: Payer: Self-pay | Admitting: Urology

## 2021-10-18 DIAGNOSIS — R972 Elevated prostate specific antigen [PSA]: Secondary | ICD-10-CM

## 2021-11-28 ENCOUNTER — Other Ambulatory Visit: Payer: Self-pay

## 2021-11-28 ENCOUNTER — Ambulatory Visit
Admission: RE | Admit: 2021-11-28 | Discharge: 2021-11-28 | Disposition: A | Discharge status: Home / Self Care | Payer: Managed Care, Other (non HMO) | Source: Ambulatory Visit | Attending: Urology | Admitting: Urology

## 2021-11-28 DIAGNOSIS — R972 Elevated prostate specific antigen [PSA]: Secondary | ICD-10-CM

## 2021-11-28 MED ORDER — GADOBENATE DIMEGLUMINE 529 MG/ML IV SOLN
18.0000 mL | Freq: Once | INTRAVENOUS | Status: AC | PRN
  Administered 2021-11-28: 18 mL via INTRAVENOUS

## 2022-01-12 ENCOUNTER — Encounter: Payer: Self-pay | Admitting: Internal Medicine

## 2022-04-04 ENCOUNTER — Other Ambulatory Visit: Payer: Self-pay | Admitting: Internal Medicine

## 2022-05-31 ENCOUNTER — Encounter: Payer: Self-pay | Admitting: Internal Medicine

## 2022-05-31 ENCOUNTER — Ambulatory Visit (INDEPENDENT_AMBULATORY_CARE_PROVIDER_SITE_OTHER): Payer: Managed Care, Other (non HMO) | Admitting: Internal Medicine

## 2022-05-31 VITALS — BP 126/76 | HR 52 | Temp 98.6°F | Resp 16 | Ht 68.0 in | Wt 198.1 lb

## 2022-05-31 DIAGNOSIS — E039 Hypothyroidism, unspecified: Secondary | ICD-10-CM

## 2022-05-31 DIAGNOSIS — I10 Essential (primary) hypertension: Secondary | ICD-10-CM

## 2022-05-31 DIAGNOSIS — Z0000 Encounter for general adult medical examination without abnormal findings: Secondary | ICD-10-CM | POA: Diagnosis not present

## 2022-05-31 LAB — LIPID PANEL
Cholesterol: 132 mg/dL (ref 0–200)
HDL: 31.7 mg/dL — ABNORMAL LOW (ref >39.00)
NonHDL: 100.09
Total CHOL/HDL Ratio: 4
Triglycerides: 267 mg/dL — ABNORMAL HIGH (ref 0.0–149.0)
VLDL: 53.4 mg/dL — ABNORMAL HIGH (ref 0.0–40.0)

## 2022-05-31 LAB — COMPREHENSIVE METABOLIC PANEL
ALT: 23 U/L (ref 0–53)
AST: 19 U/L (ref 0–37)
Albumin: 4.4 g/dL (ref 3.5–5.2)
Alkaline Phosphatase: 108 U/L (ref 39–117)
BUN: 19 mg/dL (ref 6–23)
CO2: 27 mEq/L (ref 19–32)
Calcium: 8.8 mg/dL (ref 8.4–10.5)
Chloride: 104 mEq/L (ref 96–112)
Creatinine, Ser: 1.07 mg/dL (ref 0.40–1.50)
GFR: 74.49 mL/min (ref >60.00)
Glucose, Bld: 101 mg/dL — ABNORMAL HIGH (ref 70–99)
Potassium: 3.7 mEq/L (ref 3.5–5.1)
Sodium: 139 mEq/L (ref 135–145)
Total Bilirubin: 0.5 mg/dL (ref 0.2–1.2)
Total Protein: 7.5 g/dL (ref 6.0–8.3)

## 2022-05-31 LAB — TSH: TSH: 1.91 u[IU]/mL (ref 0.35–5.50)

## 2022-05-31 LAB — LDL CHOLESTEROL, DIRECT: Direct LDL: 57 mg/dL

## 2022-05-31 NOTE — Progress Notes (Signed)
Subjective:    Patient ID: Daryl Adkins, male    DOB: 11/24/1960, 61 y.o.   MRN: [id number]  DOS:  05/31/2022 Type of visit - description: cpx  Here for CPX.  Has no major concerns.     Review of Systems   A 14 point review of systems is negative    Past Medical History:  Diagnosis Date   Allergy    Benign localized prostatic hyperplasia with lower urinary tract symptoms (LUTS)    Complication of anesthesia    HARD TO WAKE   Depression    Elevated PSA    Erectile dysfunction    Feeling of incomplete bladder emptying    Hypertension    Migraine headache    Open-angle glaucoma of both eyes    OSA on CPAP followed by dr Craige Cotta   per study 09-13-2087 mild osa   Sleep apnea    Wears glasses     Past Surgical History:  Procedure Laterality Date   GLAUCOMA SURGERY  2013, 2020   at [facility] HERNIA REPAIR Bilateral 2002   PROSTATE BIOPSY  12-2009   no cancer    THULIUM LASER TURP (TRANSURETHRAL RESECTION OF PROSTATE) N/A 02/11/2017   Procedure: THULIUM LASER TURP (TRANSURETHRAL RESECTION OF PROSTATE);  Surgeon: Jethro Bolus, MD;  Location: [facility];  Service: Urology;  Laterality: N/A;   TONSILLECTOMY  age 72   TRABECULECTOMY  06/25/2019   UMBILICAL HERNIA REPAIR  2006    Social History   Socioeconomic History   Marital status: Married    Spouse name: Not on file   Number of children: 5   Years of education: Not on file   Highest education level: Not on file  Occupational History   Occupation: [employer]  Tobacco Use   Smoking status: Never   Smokeless tobacco: Never  Vaping Use   Vaping Use: Never used  Substance and Sexual Activity   Alcohol use: Yes    Comment: OCCASIONAL   Drug use: No   Sexual activity: Not on file    Comment: VASECTOMY  Other Topics Concern   Not on file  Social History Narrative   Lives w/ wife   Social Determinants of Health   Financial Resource Strain: Not on file  Food Insecurity: Not on file   Transportation Needs: Not on file  Physical Activity: Not on file  Stress: Not on file  Social Connections: Not on file  Intimate Partner Violence: Not on file     Current Outpatient Medications  Medication Instructions   amLODipine (NORVASC) 5 MG tablet TAKE ONE TABLET BY MOUTH EVERY DAY   dorzolamide-timolol (COSOPT) 22.3-6.8 MG/ML ophthalmic solution 1 drop, Right Eye, 2 times daily   latanoprost (XALATAN) 0.005 % ophthalmic solution 1 drop, Daily at bedtime   levothyroxine (SYNTHROID) 100 MCG tablet TAKE ONE TABLET BY MOUTH DAILY BEFORE BREAKFAST       Objective:   Physical Exam BP 126/76   Pulse (!) 52   Temp 98.6 F (37 C) (Oral)   Resp 16   Ht 5\' 8"  (1.727 m)   Wt 198 lb 2 oz (89.9 kg)   SpO2 96%   BMI 30.12 kg/m  General: Well developed, NAD, BMI noted Neck: No  thyromegaly  HEENT:  Normocephalic . Face symmetric, atraumatic Lungs:  CTA B Normal respiratory effort, no intercostal retractions, no accessory muscle use. Heart: RRR,  no murmur.  Abdomen:  Not distended, soft, non-tender. No rebound or rigidity.  Lower extremities: no pretibial edema bilaterally  Skin: Exposed areas without rash. Not pale. Not jaundice Neurologic:  alert & oriented X3.  Speech normal, gait appropriate for age and unassisted Strength symmetric and appropriate for age.  Psych: Cognition and judgment appear intact.  Cooperative with normal attention span and concentration.  Behavior appropriate. No anxious or depressed appearing.     Assessment     Assessment HTN started meds 2013 Migraine headaches Hypothyroidism: Started Synthroid 09-2018 OSA-- on Cpap Glaucoma BPH, elevated PSA , (-) prostate BX 2011, TURP 02-2017, sees urology   PLAN: Here for CPX HTN: BP is very good today, recommend to check at home, continue amlodipine, check labs Hypothyroidism: Reports good compliance, Check labs. RTC 1 year

## 2022-05-31 NOTE — Assessment & Plan Note (Signed)
  Tdap 2014 pnm 23 : 2017 S/p Shingrix Covid shots: bivalent booster recommended   Rec flu shot q year CCS: scope 06/2018, no polyps, + int hemorrhoids, 10 years PSA per urology  Labs: CMP FLP CBC TSH a1c Diet: Healthy most of the time.  Since last year, got a bicycle and is riding it 3 times a week. POA: Discussed CV RF: No family history, no symptoms, last LDL 73

## 2022-05-31 NOTE — Assessment & Plan Note (Signed)
Here for CPX HTN: BP is very good today, recommend to check at home, continue amlodipine, check labs Hypothyroidism: Reports good compliance, Check labs. RTC 1 year

## 2022-05-31 NOTE — Patient Instructions (Addendum)
Recommend to proceed with the following vaccines at your pharmacy:  Covid booster (bivalent) Flu shot this fall   Check the  blood pressure regularly BP GOAL is between 110/65 and  135/85. If it is consistently higher or lower, let me know   Please read information about preparing a healthcare power of attorney   GO TO THE LAB : Get the blood work     GO TO THE FRONT DESK, PLEASE SCHEDULE YOUR APPOINTMENTS Come back for a physical exam in 1 year

## 2022-06-25 ENCOUNTER — Other Ambulatory Visit: Payer: Self-pay | Admitting: Internal Medicine

## 2022-06-25 MED ORDER — LEVOTHYROXINE SODIUM 100 MCG PO TABS: 100.0000 ug | ORAL_TABLET | Freq: Every day | ORAL | 1 refills | Status: DC

## 2022-06-25 MED ORDER — AMLODIPINE BESYLATE 5 MG PO TABS: 5.0000 mg | ORAL_TABLET | Freq: Every day | ORAL | 1 refills | Status: DC

## 2022-07-13 LAB — PSA: PSA: 11.8

## 2022-07-20 ENCOUNTER — Encounter: Payer: Self-pay | Admitting: Internal Medicine

## 2022-10-06 ENCOUNTER — Other Ambulatory Visit: Payer: Self-pay | Admitting: Internal Medicine

## 2023-03-06 HISTORY — PX: TRABECULECTOMY: SHX107

## 2023-03-21 HISTORY — PX: EYE SURGERY: SHX253

## 2023-06-03 IMAGING — MR MR PROSTATE WO/W CM
12 series · 48 of 48 positions shown · IV contrast (18ml multihance)
Comparison: 11/25/2013

CLINICAL DATA: Elevated PSA.

EXAM:
MR PROSTATE WITHOUT AND WITH CONTRAST
TECHNIQUE: Multiplanar multisequence MRI images were obtained of the pelvis
centered about the prostate. Pre and post contrast images were
obtained.
CONTRAST:  18mL MULTIHANCE GADOBENATE DIMEGLUMINE 529 MG/ML IV SOLN

[Series 3: T2 · coronal · 3.0mm · 0.56mm/px · 1 of 23 slices shown (1 of 3)]
[im 1/23]
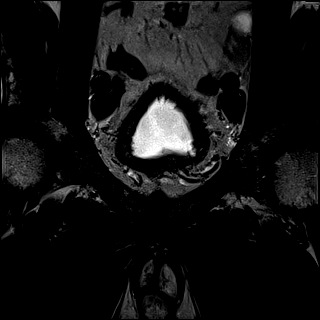

[Series 4: T1 · axial · 5.0mm · 1.25mm/px · 1 of 80 slices shown]
[im 1/80]
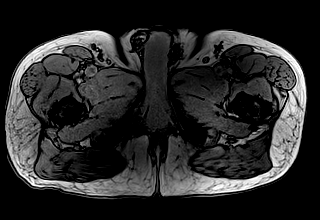

[Series 5: DWI · axial · 3.0mm · 1.75mm/px · z∈[-6,+63]mm · 2 of 72 slices shown (1 of 3)]
[im 1/72]
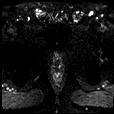
[im 72/72]
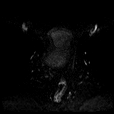

[Series 6: DWI · axial · 3.0mm · 1.75mm/px · 1 of 24 slices shown (2 of 3)]
[im 1/24]
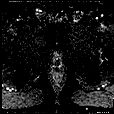

[Series 7: DWI · axial · 3.0mm · 1.75mm/px · 1 of 24 slices shown (3 of 3)]
[im 1/24]
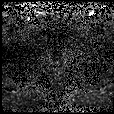

[Series 8: T2 · axial · 3.0mm · 0.56mm/px · 1 of 24 slices shown (2 of 3)]
[im 1/24]
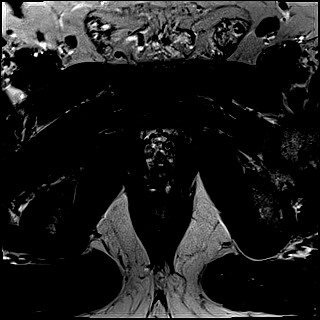

[Series 9: T2 · axial · 1.0mm · 1.04mm/px · z∈[-7,+64]mm · 2 of 72 slices shown (3 of 3)]
[im 1/72]
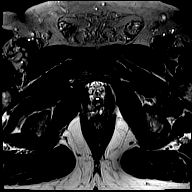
[im 72/72]
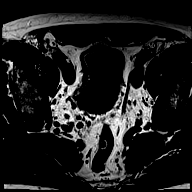

[Series 10: pre t1_twist_tra_dyn · axial · non-contrast · 3.5mm · 0.83mm/px · 1 of 22 slices shown]
[im 1/22]
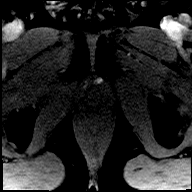

[Series 11: post t1_twist_tra_dyn-copy center · axial · non-contrast · 3.5mm · 0.83mm/px · z∈[-5,+62]mm · 17 of 600 slices shown]
[im 1/600]
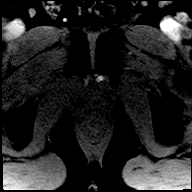
[im 38/600]
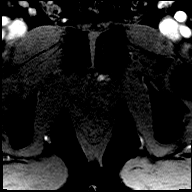
[im 75/600]
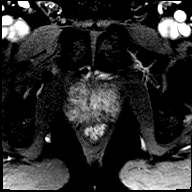
[im 113/600]
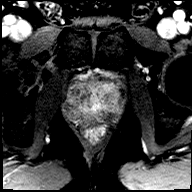
[im 150/600]
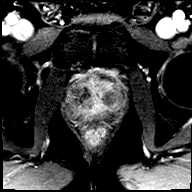
[im 188/600]
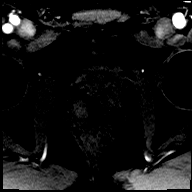
[im 225/600]
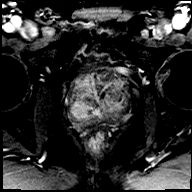
[im 263/600]
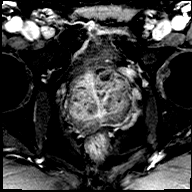
[im 300/600]
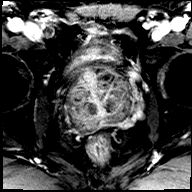
[im 337/600]
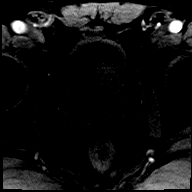
[im 375/600]
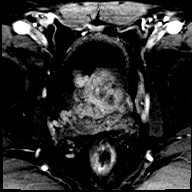
[im 412/600]
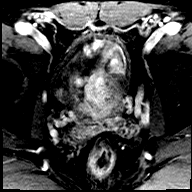
[im 450/600]
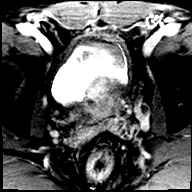
[im 487/600]
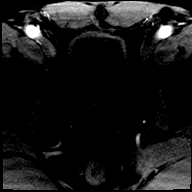
[im 525/600]
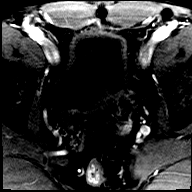
[im 562/600]
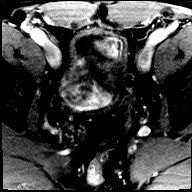
[im 600/600]
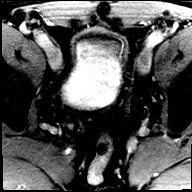

[Series 12: post t1_twist_tra_dyn-copy cent_sub · axial · 3.5mm · 0.83mm/px · z∈[-5,+62]mm · 17 of 580 slices shown]
[im 1/580]
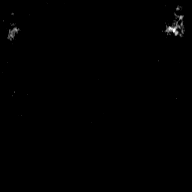
[im 37/580]
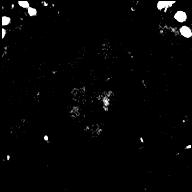
[im 73/580]
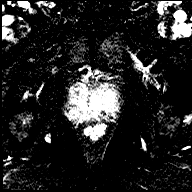
[im 109/580]
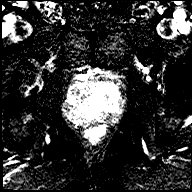
[im 145/580]
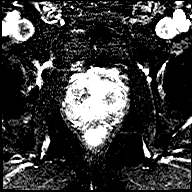
[im 181/580]
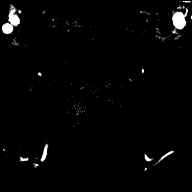
[im 218/580]
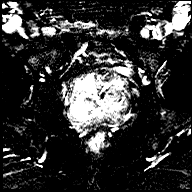
[im 254/580]
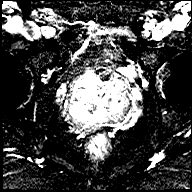
[im 290/580]
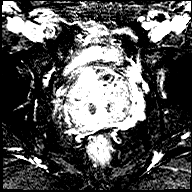
[im 326/580]
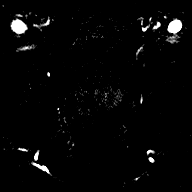
[im 362/580]
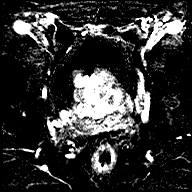
[im 399/580]
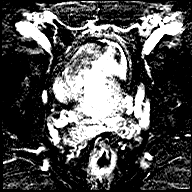
[im 435/580]
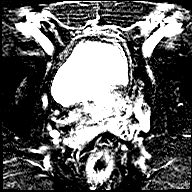
[im 471/580]
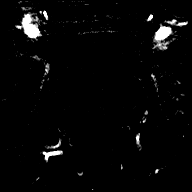
[im 507/580]
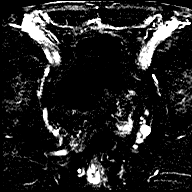
[im 543/580]
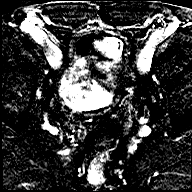
[im 580/580]
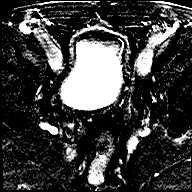

[Series 13: t1_vibe_dixon_tra_f · axial · 2.5mm · 0.91mm/px · z∈[-37,+161]mm · 2 of 80 slices shown]
[im 1/80]
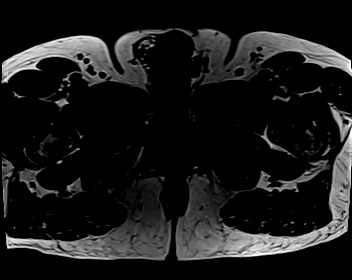
[im 80/80]
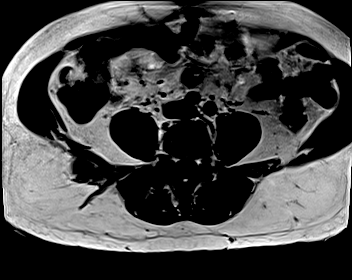

[Series 14: t1_vibe_dixon_tra_w · axial · 2.5mm · 0.91mm/px · z∈[-37,+161]mm · 2 of 80 slices shown]
[im 1/80]
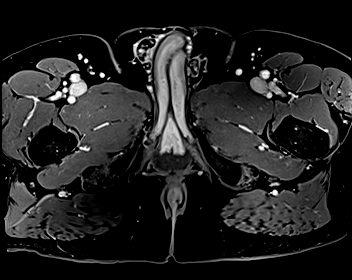
[im 80/80]
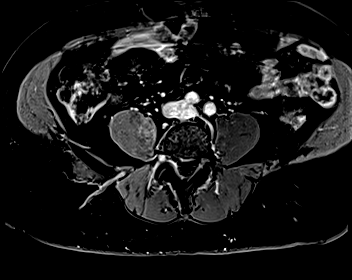

[48 of 48 positions shown; findings below may reference images not displayed]

FINDINGS: Prostate:

-- Peripheral Zone: 2 exophytic BPH nodules are seen in the right
anterior and lateral apex, both measuring 1.7 cm. These have
increased in size since previous study in 9180. Linear/wedge shaped
hypointensities are noted on ADC; however, no focal ADC hypointense
or high b-value DWI hyperintense nodules are identified.

-- Transition/Central Zone: Moderately enlarged with encapsulated
BPH nodules. No suspicious nodules identified on T2-weighted or
diffusion sequences.

-- Measurements/Volume:  6.0 by 5.1 x 5.5 cm (volume = 88 cm^3)

Transcapsular spread:  Absent

Seminal vesicle involvement:  Absent

Neurovascular bundle involvement:  Absent

Pelvic adenopathy: None visualized

Bone metastasis: None visualized

Other: Diffuse bladder wall thickening, consistent with chronic
bladder outlet obstruction.
IMPRESSION: No radiographic evidence of high-grade prostate carcinoma. PI-RADS 2
(v.2.1): Low (clinically significant cancer unlikely)

Incidentally noted exophytic BPH nodules in the right apex, further
increased in size since previous study.

## 2023-06-07 ENCOUNTER — Encounter: Payer: Self-pay | Admitting: Internal Medicine

## 2023-06-07 ENCOUNTER — Ambulatory Visit: Payer: 59 | Admitting: Internal Medicine

## 2023-06-07 VITALS — BP 136/68 | HR 57 | Temp 97.9°F | Resp 16 | Ht 68.0 in | Wt 191.1 lb

## 2023-06-07 DIAGNOSIS — Z0000 Encounter for general adult medical examination without abnormal findings: Secondary | ICD-10-CM

## 2023-06-07 DIAGNOSIS — Z23 Encounter for immunization: Secondary | ICD-10-CM

## 2023-06-07 DIAGNOSIS — E039 Hypothyroidism, unspecified: Secondary | ICD-10-CM | POA: Diagnosis not present

## 2023-06-07 DIAGNOSIS — I10 Essential (primary) hypertension: Secondary | ICD-10-CM

## 2023-06-07 LAB — CBC WITH DIFFERENTIAL/PLATELET
Basophils Absolute: 0.1 10*3/uL (ref 0.0–0.1)
Basophils Relative: 1.1 % (ref 0.0–3.0)
Eosinophils Absolute: 0.2 10*3/uL (ref 0.0–0.7)
Eosinophils Relative: 3 % (ref 0.0–5.0)
HCT: 43.7 % (ref 39.0–52.0)
Hemoglobin: 14.5 g/dL (ref 13.0–17.0)
Lymphocytes Relative: 27.6 % (ref 12.0–46.0)
Lymphs Abs: 2.2 10*3/uL (ref 0.7–4.0)
MCHC: 33.1 g/dL (ref 30.0–36.0)
MCV: 90.2 fl (ref 78.0–100.0)
Monocytes Absolute: 0.7 10*3/uL (ref 0.1–1.0)
Monocytes Relative: 9 % (ref 3.0–12.0)
Neutro Abs: 4.7 10*3/uL (ref 1.4–7.7)
Neutrophils Relative %: 59.3 % (ref 43.0–77.0)
Platelets: 240 10*3/uL (ref 150.0–400.0)
RBC: 4.85 Mil/uL (ref 4.22–5.81)
RDW: 14.2 % (ref 11.5–15.5)
WBC: 7.9 10*3/uL (ref 4.0–10.5)

## 2023-06-07 LAB — LIPID PANEL
Cholesterol: 141 mg/dL (ref 0–200)
HDL: 34.1 mg/dL — ABNORMAL LOW (ref >39.00)
NonHDL: 106.83
Total CHOL/HDL Ratio: 4
Triglycerides: 280 mg/dL — ABNORMAL HIGH (ref 0.0–149.0)
VLDL: 56 mg/dL — ABNORMAL HIGH (ref 0.0–40.0)

## 2023-06-07 LAB — COMPREHENSIVE METABOLIC PANEL
ALT: 18 U/L (ref 0–53)
AST: 16 U/L (ref 0–37)
Albumin: 4.2 g/dL (ref 3.5–5.2)
Alkaline Phosphatase: 134 U/L — ABNORMAL HIGH (ref 39–117)
BUN: 15 mg/dL (ref 6–23)
CO2: 26 mEq/L (ref 19–32)
Calcium: 9.1 mg/dL (ref 8.4–10.5)
Chloride: 105 mEq/L (ref 96–112)
Creatinine, Ser: 0.97 mg/dL (ref 0.40–1.50)
GFR: 83.2 mL/min (ref >60.00)
Glucose, Bld: 99 mg/dL (ref 70–99)
Potassium: 3.8 mEq/L (ref 3.5–5.1)
Sodium: 140 mEq/L (ref 135–145)
Total Bilirubin: 0.4 mg/dL (ref 0.2–1.2)
Total Protein: 7.2 g/dL (ref 6.0–8.3)

## 2023-06-07 LAB — LDL CHOLESTEROL, DIRECT: Direct LDL: 72 mg/dL

## 2023-06-07 LAB — TSH: TSH: 1.82 u[IU]/mL (ref 0.35–5.50)

## 2023-06-07 LAB — HEMOGLOBIN A1C: Hgb A1c MFr Bld: 5.5 % (ref 4.6–6.5)

## 2023-06-07 MED ORDER — AMLODIPINE BESYLATE 10 MG PO TABS: 10.0000 mg | ORAL_TABLET | Freq: Every day | ORAL | 6 refills | Status: DC

## 2023-06-07 NOTE — Patient Instructions (Addendum)
Vaccines I recommend: Flu shot every fall Covid booster RSV vaccine   Amlodipine: Increase to 10 mg every day Check the  blood pressure regularly BP GOAL is between 110/65 and  135/85. If it is consistently higher or lower, let me know      GO TO THE LAB : Get the blood work     GO TO THE FRONT DESK, PLEASE SCHEDULE YOUR APPOINTMENTS Come back for checkup in 6 months    "Health Care Power of attorney" ,  "Living will" (Advance care planning documents)  If you already have a living will or healthcare power of attorney, is recommended you bring the copy to be scanned in your chart.   The document will be available to all the doctors you see in the system.  Advance care planning is a process that supports adults in  understanding and sharing their preferences regarding future medical care.  The patient's preferences are recorded in documents called Advance Directives and the can be modified at any time while the patient is in full mental capacity.   If you don't have one, please consider create one.      More information at: [email or web address]

## 2023-06-07 NOTE — Progress Notes (Unsigned)
Subjective:    Patient ID: Daryl Adkins, male    DOB: [birth year], 62 y.o.   MRN: [id number]  DOS:  06/07/2023 Type of visit - description: CPX  Since the last office visit is doing well.   Review of Systems See above   Past Medical History:  Diagnosis Date   Allergy    Benign localized prostatic hyperplasia with lower urinary tract symptoms (LUTS)    Complication of anesthesia    HARD TO WAKE   Depression    Elevated PSA    Erectile dysfunction    Feeling of incomplete bladder emptying    Hypertension    Migraine headache    Open-angle glaucoma of both eyes    OSA on CPAP followed by dr Craige Cotta   per study 09-13-2087 mild osa   Sleep apnea    Wears glasses     Past Surgical History:  Procedure Laterality Date   EYE SURGERY  03/21/2023   glaucoma   GLAUCOMA SURGERY  2013, 2020   at [facility] HERNIA REPAIR Bilateral 2002   PROSTATE BIOPSY  12/2009   no cancer    THULIUM LASER TURP (TRANSURETHRAL RESECTION OF PROSTATE) N/A 02/11/2017   Procedure: THULIUM LASER TURP (TRANSURETHRAL RESECTION OF PROSTATE);  Surgeon: Jethro Bolus, MD;  Location: [facility];  Service: Urology;  Laterality: N/A;   TONSILLECTOMY  age 1   TRABECULECTOMY  06/25/2019   UMBILICAL HERNIA REPAIR  2006    Current Outpatient Medications  Medication Instructions   amLODipine (NORVASC) 5 MG tablet TAKE 1 TABLET(5 MG) BY MOUTH DAILY   Difluprednate 0.05 % EMUL 1 drop, Right Eye, 2 times daily   dorzolamide-timolol (COSOPT) 22.3-6.8 MG/ML ophthalmic solution 1 drop, 2 times daily   latanoprost (XALATAN) 0.005 % ophthalmic solution 1 drop, Daily at bedtime   levothyroxine (SYNTHROID) 100 MCG tablet TAKE 1 TABLET(100 MCG) BY MOUTH DAILY BEFORE BREAKFAST       Objective:   Physical Exam BP (!) 140/81   Pulse (!) 57   Temp 97.9 F (36.6 C) (Oral)   Resp 16   Ht 5\' 8"  (1.727 m)   Wt 191 lb 2 oz (86.7 kg)   SpO2 97%   BMI 29.06 kg/m  General: Well developed,  NAD, BMI noted Neck: No  thyromegaly  HEENT:  Normocephalic . Face symmetric, atraumatic Lungs:  CTA B Normal respiratory effort, no intercostal retractions, no accessory muscle use. Heart: RRR,  no murmur.  Abdomen:  Not distended, soft, non-tender. No rebound or rigidity.   Lower extremities: no pretibial edema bilaterally  Skin: Exposed areas without rash. Not pale. Not jaundice Neurologic:  alert & oriented X3.  Speech normal, gait appropriate for age and unassisted Strength symmetric and appropriate for age.  Psych: Cognition and judgment appear intact.  Cooperative with normal attention span and concentration.  Behavior appropriate. No anxious or depressed appearing.     Assessment     Assessment HTN started meds 2013 Migraine headaches Hypothyroidism: Started Synthroid 09-2018 OSA-- on Cpap Glaucoma BPH, elevated PSA , (-) prostate BX 2011, TURP 02-2017, sees urology   PLAN: Here for CPX  Tdap booster 06/07/2023 pnm 23 : 2017 S/p Shingrix Vaccines I recommend: RSV, COVID booster, flu shot every fall CCS: scope 06/2018, no polyps, + int hemorrhoids, 10 years Prostate cancer screening: Sees urology, last available OV note 08-2022 Labs: CMP FLP CBC A1c TSH Diet: Recommend healthy diet exercise at least 3 hours a week Healthcare POA:  Information provided    HTN: On amlodipine 5 mg, ambulatory BPs in the 140s, BP upon arrival 140/81.  Recheck 136/68. Recommend low-salt diet, exercise, increase amlodipine to 10 mg.  Continue monitor ambulatory BPs  Hypothyroidism: Check a TSH OSA: Reports good CPAP compliance Glaucoma: Had a trabeculectomy, R, still having some issues. RTC 6 months   Cardiovascular risk: 10-year risk elevated at 17.8, no smoker, no FH of CAD.  This was discussed with the patient, she could take statins proactively.  Coronary calcium score is a option. Disease 6 months The 10-year ASCVD risk score (Arnett DK, et al., 2019) is: 17.8%   Values  used to calculate the score:     Age: 65 years     Sex: Male     Is Non-Hispanic African American: Yes     Diabetic: No     Tobacco smoker: No     Systolic Blood Pressure: 140 mmHg     Is BP treated: Yes     HDL Cholesterol: 31.7 mg/dL     Total Cholesterol: 132 mg/dL      HTN: BP is very good today, recommend to check at home, continue amlodipine, check labs Hypothyroidism: Reports good compliance, Check labs. RTC 1 year

## 2023-06-09 ENCOUNTER — Encounter: Payer: Self-pay | Admitting: Internal Medicine

## 2023-06-09 NOTE — Assessment & Plan Note (Signed)
Here for CPX Tdap booster 06/07/2023 pnm 23 : 2017 S/p Shingrix Vaccines I recommend: RSV, COVID booster, flu shot every fall CCS: scope 06/2018, no polyps, + int hemorrhoids, 10 years Prostate cancer screening: Sees urology, last available OV note 08-2022 Labs: CMP FLP CBC A1c TSH Diet: Recommend healthy diet exercise at least 3 hours a week Healthcare POA: Information provided

## 2023-06-09 NOTE — Assessment & Plan Note (Signed)
Here for CPX  HTN: On amlodipine 5 mg, ambulatory BPs in the 140s, BP upon arrival 140/81.  Recheck 136/68. Recommend low-salt diet, exercise, increase amlodipine to 10 mg.  Continue monitor ambulatory BPs Hypothyroidism: Check a TSH OSA: Reports good CPAP compliance CV risk:  elevated , not a smoker, no FH of CAD.  This was discussed with the patient, could take statins proactively.  Coronary calcium score is a option. Glaucoma- Had a trabeculectomy, R, still having some issues. RTC 6 months

## 2023-07-19 ENCOUNTER — Other Ambulatory Visit: Payer: Self-pay

## 2023-07-19 MED ORDER — LEVOTHYROXINE SODIUM 100 MCG PO TABS: 100.0000 ug | ORAL_TABLET | Freq: Every day | ORAL | 1 refills | Status: DC

## 2023-12-13 ENCOUNTER — Encounter: Payer: Self-pay | Admitting: Internal Medicine

## 2023-12-13 ENCOUNTER — Ambulatory Visit (INDEPENDENT_AMBULATORY_CARE_PROVIDER_SITE_OTHER): Payer: 59 | Admitting: Internal Medicine

## 2023-12-13 VITALS — BP 126/70 | HR 52 | Temp 97.8°F | Resp 16 | Ht 68.0 in | Wt 195.5 lb

## 2023-12-13 DIAGNOSIS — E039 Hypothyroidism, unspecified: Secondary | ICD-10-CM

## 2023-12-13 DIAGNOSIS — Z9189 Other specified personal risk factors, not elsewhere classified: Secondary | ICD-10-CM

## 2023-12-13 DIAGNOSIS — I10 Essential (primary) hypertension: Secondary | ICD-10-CM | POA: Diagnosis not present

## 2023-12-13 LAB — BASIC METABOLIC PANEL
BUN: 17 mg/dL (ref 6–23)
CO2: 25 meq/L (ref 19–32)
Calcium: 9 mg/dL (ref 8.4–10.5)
Chloride: 105 meq/L (ref 96–112)
Creatinine, Ser: 1.04 mg/dL (ref 0.40–1.50)
GFR: 76.25 mL/min (ref >60.00)
Glucose, Bld: 90 mg/dL (ref 70–99)
Potassium: 3.5 meq/L (ref 3.5–5.1)
Sodium: 144 meq/L (ref 135–145)

## 2023-12-13 LAB — TSH: TSH: 1.23 u[IU]/mL (ref 0.35–5.50)

## 2023-12-13 NOTE — Progress Notes (Signed)
 Subjective:    Patient ID: Daryl Adkins, male    DOB: [birth year], 63 y.o.   MRN: [id number]  DOS:  12/13/2023 Type of visit - description: f/u  Since the last office visit, had glaucoma surgery. Ambulatory BPs are very good, Good CPAP compliance.  Denies chest pain or difficulty breathing No nausea vomiting.  No diarrhea or constipation. Complaining of dry skin  Review of Systems See above   Past Medical History:  Diagnosis Date   Allergy    Benign localized prostatic hyperplasia with lower urinary tract symptoms (LUTS)    Complication of anesthesia    HARD TO WAKE   Depression    Elevated PSA    Erectile dysfunction    Feeling of incomplete bladder emptying    Hypertension    Migraine headache    Open-angle glaucoma of both eyes    OSA on CPAP followed by dr shellia   per study 09-13-2087 mild osa   Sleep apnea    Wears glasses     Past Surgical History:  Procedure Laterality Date   EYE SURGERY  03/21/2023   glaucoma   GLAUCOMA SURGERY  2013, 2020   at [facility] HERNIA REPAIR Bilateral 2002   PROSTATE BIOPSY  12/2009   no cancer    THULIUM LASER TURP (TRANSURETHRAL RESECTION OF PROSTATE) N/A 02/11/2017   Procedure: THULIUM LASER TURP (TRANSURETHRAL RESECTION OF PROSTATE);  Surgeon: Arlena Gal, MD;  Location: [facility];  Service: Urology;  Laterality: N/A;   TONSILLECTOMY  age 24   TRABECULECTOMY  06/25/2019   TRABECULECTOMY Right 03/2023   UMBILICAL HERNIA REPAIR  2006    Current Outpatient Medications  Medication Instructions   amLODipine  (NORVASC ) 10 mg, Oral, Daily   dorzolamide-timolol (COSOPT) 2-0.5 % ophthalmic solution 1 drop, 2 times daily   levothyroxine  (SYNTHROID ) 100 mcg, Oral, Daily before breakfast   prednisoLONE acetate (PRED FORTE) 1 % ophthalmic suspension 1 drop, 6 times daily       Objective:   Physical Exam Skin:         Comments: Has a round patch of hyperpigmentation on a symmetric fashion at  the lower extremities,    BP 126/70   Pulse (!) 52   Temp 97.8 F (36.6 C) (Oral)   Resp 16   Ht 5' 8 (1.727 m)   Wt 195 lb 8 oz (88.7 kg)   SpO2 94%   BMI 29.73 kg/m  General:   Well developed, NAD, BMI noted. HEENT:  Normocephalic . Face symmetric, atraumatic Lungs:  CTA B Normal respiratory effort, no intercostal retractions, no accessory muscle use. Heart: RRR,  no murmur.  Lower extremities: no pretibial edema bilaterally  Skin: Very dry skin at the lower extremities.  See graphic. Neurologic:  alert & oriented X3.  Speech normal, gait appropriate for age and unassisted Psych--  Cognition and judgment appear intact.  Cooperative with normal attention span and concentration.  Behavior appropriate. No anxious or depressed appearing.      Assessment     Assessment HTN started meds 2013 Migraine headaches Hypothyroidism: Started Synthroid  09-2018 OSA-- on Cpap Glaucoma BPH, elevated PSA , (-) prostate BX 2011, TURP 02-2017, sees urology   PLAN: HTN: On amlodipine  10 mg daily, no edema noted, ambulatory BPs never more than 130.  No change, check BMP. Hypothyroidism: On Synthroid , check TSH OSA: Good CPAP compliance. Increased CV risk: 16.9% despite a total cholesterol of 141 and a LDL of 72.  This  was discussed with the patient, he is not inclined to take medication at this point. Glaucoma: Had R eye surgery, vision is still blurred, reports further surgery is planned. Vaccines: Had flu shot, COVID booster, RSV at [facility] per patient. RTC 06-2024 CPX

## 2023-12-13 NOTE — Patient Instructions (Addendum)
 Continue checking your blood pressure regularly Blood pressure goal:  between 110/65 and  135/85. If it is consistently higher or lower, let me know  For dry skin: - Avoid very hot showers - Apply over-the-counter Aveeno to your legs after every shower  GO TO THE LAB : Get the blood work     Next visit with me by [phone] for a physical exam please. Schedule it at the front desk

## 2023-12-14 NOTE — Assessment & Plan Note (Signed)
 HTN: On amlodipine  10 mg daily, no edema noted, ambulatory BPs never more than 130.  No change, check BMP. Hypothyroidism: On Synthroid , check TSH OSA: Good CPAP compliance. Increased CV risk: 16.9% despite a total cholesterol of 141 and a LDL of 72.  This was discussed with the patient, he is not inclined to take medication at this point. Glaucoma: Had R eye surgery, vision is still blurred, reports further surgery is planned. Vaccines: Had flu shot, COVID booster, RSV at [facility] per patient. RTC 06-2024 CPX

## 2023-12-15 ENCOUNTER — Encounter: Payer: Self-pay | Admitting: Internal Medicine

## 2023-12-31 ENCOUNTER — Other Ambulatory Visit: Payer: Self-pay | Admitting: Internal Medicine

## 2024-01-07 ENCOUNTER — Encounter: Payer: Self-pay | Admitting: Internal Medicine

## 2024-01-07 DIAGNOSIS — G4733 Obstructive sleep apnea (adult) (pediatric): Secondary | ICD-10-CM

## 2024-01-17 ENCOUNTER — Other Ambulatory Visit: Payer: Self-pay | Admitting: Internal Medicine

## 2024-04-22 LAB — PSA: PSA: 19.1

## 2024-04-29 ENCOUNTER — Encounter: Payer: Self-pay | Admitting: Internal Medicine

## 2024-04-29 ENCOUNTER — Other Ambulatory Visit: Payer: Self-pay | Admitting: Urology

## 2024-04-29 DIAGNOSIS — R972 Elevated prostate specific antigen [PSA]: Secondary | ICD-10-CM

## 2024-05-01 ENCOUNTER — Encounter: Payer: Self-pay | Admitting: Urology

## 2024-06-05 ENCOUNTER — Ambulatory Visit
Admission: RE | Admit: 2024-06-05 | Discharge: 2024-06-05 | Disposition: A | Discharge status: Home / Self Care | Source: Ambulatory Visit | Attending: Urology | Admitting: Urology

## 2024-06-05 DIAGNOSIS — R972 Elevated prostate specific antigen [PSA]: Secondary | ICD-10-CM

## 2024-06-05 MED ORDER — GADOPICLENOL 0.5 MMOL/ML IV SOLN
9.0000 mL | Freq: Once | INTRAVENOUS | Status: AC | PRN
  Administered 2024-06-05: 9 mL via INTRAVENOUS

## 2024-06-12 ENCOUNTER — Ambulatory Visit: Payer: 59 | Admitting: Internal Medicine

## 2024-06-12 VITALS — BP 126/70 | HR 54 | Temp 97.9°F | Resp 16 | Ht 68.0 in | Wt 193.2 lb

## 2024-06-12 DIAGNOSIS — Z23 Encounter for immunization: Secondary | ICD-10-CM | POA: Diagnosis not present

## 2024-06-12 DIAGNOSIS — Z0000 Encounter for general adult medical examination without abnormal findings: Secondary | ICD-10-CM

## 2024-06-12 DIAGNOSIS — Z13220 Encounter for screening for lipoid disorders: Secondary | ICD-10-CM | POA: Diagnosis not present

## 2024-06-12 DIAGNOSIS — Z0001 Encounter for general adult medical examination with abnormal findings: Secondary | ICD-10-CM

## 2024-06-12 DIAGNOSIS — H409 Unspecified glaucoma: Secondary | ICD-10-CM

## 2024-06-12 DIAGNOSIS — E039 Hypothyroidism, unspecified: Secondary | ICD-10-CM

## 2024-06-12 DIAGNOSIS — Z9189 Other specified personal risk factors, not elsewhere classified: Secondary | ICD-10-CM

## 2024-06-12 DIAGNOSIS — Z136 Encounter for screening for cardiovascular disorders: Secondary | ICD-10-CM

## 2024-06-12 DIAGNOSIS — I10 Essential (primary) hypertension: Secondary | ICD-10-CM | POA: Diagnosis not present

## 2024-06-12 DIAGNOSIS — G4733 Obstructive sleep apnea (adult) (pediatric): Secondary | ICD-10-CM | POA: Diagnosis not present

## 2024-06-12 LAB — LIPID PANEL
Cholesterol: 124 mg/dL (ref 0–200)
HDL: 40.8 mg/dL (ref >39.00)
LDL Cholesterol: 62 mg/dL (ref 0–99)
NonHDL: 83.51
Total CHOL/HDL Ratio: 3
Triglycerides: 110 mg/dL (ref 0.0–149.0)
VLDL: 22 mg/dL (ref 0.0–40.0)

## 2024-06-12 LAB — COMPREHENSIVE METABOLIC PANEL WITH GFR
ALT: 27 U/L (ref 0–53)
AST: 18 U/L (ref 0–37)
Albumin: 4.3 g/dL (ref 3.5–5.2)
Alkaline Phosphatase: 128 U/L — ABNORMAL HIGH (ref 39–117)
BUN: 14 mg/dL (ref 6–23)
CO2: 29 meq/L (ref 19–32)
Calcium: 8.8 mg/dL (ref 8.4–10.5)
Chloride: 103 meq/L (ref 96–112)
Creatinine, Ser: 0.87 mg/dL (ref 0.40–1.50)
GFR: 91.31 mL/min (ref >60.00)
Glucose, Bld: 89 mg/dL (ref 70–99)
Potassium: 3.5 meq/L (ref 3.5–5.1)
Sodium: 141 meq/L (ref 135–145)
Total Bilirubin: 0.5 mg/dL (ref 0.2–1.2)
Total Protein: 7.3 g/dL (ref 6.0–8.3)

## 2024-06-12 LAB — CBC WITH DIFFERENTIAL/PLATELET
Basophils Absolute: 0.1 K/uL (ref 0.0–0.1)
Basophils Relative: 0.8 % (ref 0.0–3.0)
Eosinophils Absolute: 0.3 K/uL (ref 0.0–0.7)
Eosinophils Relative: 3.6 % (ref 0.0–5.0)
HCT: 45.9 % (ref 39.0–52.0)
Hemoglobin: 15.4 g/dL (ref 13.0–17.0)
Lymphocytes Relative: 27.8 % (ref 12.0–46.0)
Lymphs Abs: 1.9 K/uL (ref 0.7–4.0)
MCHC: 33.6 g/dL (ref 30.0–36.0)
MCV: 88.4 fl (ref 78.0–100.0)
Monocytes Absolute: 0.6 K/uL (ref 0.1–1.0)
Monocytes Relative: 8.8 % (ref 3.0–12.0)
Neutro Abs: 4.1 K/uL (ref 1.4–7.7)
Neutrophils Relative %: 59 % (ref 43.0–77.0)
Platelets: 228 K/uL (ref 150.0–400.0)
RBC: 5.19 Mil/uL (ref 4.22–5.81)
RDW: 13.8 % (ref 11.5–15.5)
WBC: 7 K/uL (ref 4.0–10.5)

## 2024-06-12 LAB — TSH: TSH: 2.25 u[IU]/mL (ref 0.35–5.50)

## 2024-06-12 NOTE — Assessment & Plan Note (Signed)
 Here for CPX   Other issues HTN: On amlodipine , BP today is great, ambulatory BPs in the 130s.  No change, check labs Hypothyroidism: On Synthroid , check TSH OSA: Reports good CPAP compliance. CV risk: Although cholesterol is only 141 and BP is well-controlled his cardiovascular risk is moderately elevated.  No family history, no symptoms.  Reluctant to take a statin.  Calcium coronary score will help stratify his risk better.  He agreed to proceed. RTC 6 months

## 2024-06-12 NOTE — Patient Instructions (Signed)
 Vaccines I recommend: Flu and a COVID booster this fall  Continue checking your blood pressure regularly Blood pressure goal:  between 110/65 and  135/85. If it is consistently higher or lower, let me know     GO TO THE LAB :  Get the blood work   Your results will be posted on MyChart with my comments  Next office visit for a checkup in 6 months Please make an appointment before you leave today

## 2024-06-12 NOTE — Progress Notes (Signed)
 Subjective:    Patient ID: Daryl Adkins, male    DOB: 24-Aug-1961, 63 y.o.   MRN: [id number]  DOS:  06/12/2024 Type of visit - description: CPX  Here for CPX. Feeling well. Had a single episodes of hematuria, already discussed with urology.   Review of Systems  Other than above, a 14 point review of systems is negative     Past Medical History:  Diagnosis Date   Allergy    Benign localized prostatic hyperplasia with lower urinary tract symptoms (LUTS)    Complication of anesthesia    HARD TO WAKE   Depression    Elevated PSA    Erectile dysfunction    Feeling of incomplete bladder emptying    Hypertension    Migraine headache    Open-angle glaucoma of both eyes    OSA on CPAP followed by dr shellia   per study 09-13-2087 mild osa   Sleep apnea    Wears glasses     Past Surgical History:  Procedure Laterality Date   AQUEOUS SHUNT EXTRAOCULAR Right 01/23/2024   EYE SURGERY  03/21/2023   glaucoma   GLAUCOMA SURGERY  2013, 2020   at [facility] HERNIA REPAIR Bilateral 2002   PROSTATE BIOPSY  12/2009   no cancer    THULIUM LASER TURP (TRANSURETHRAL RESECTION OF PROSTATE) N/A 02/11/2017   Procedure: THULIUM LASER TURP (TRANSURETHRAL RESECTION OF PROSTATE);  Surgeon: Arlena Gal, MD;  Location: [facility];  Service: Urology;  Laterality: N/A;   TONSILLECTOMY  age 19   TRABECULECTOMY  06/25/2019   TRABECULECTOMY Right 03/2023   UMBILICAL HERNIA REPAIR  2006   Social History   Socioeconomic History   Marital status: Married    Spouse name: Not on file   Number of children: 5   Years of education: Not on file   Highest education level: Bachelor's degree (e.g., BA, AB, BS)  Occupational History   Occupation: [employer]  Tobacco Use   Smoking status: Never   Smokeless tobacco: Never  Vaping Use   Vaping status: Never Used  Substance and Sexual Activity   Alcohol use: Yes    Comment: OCCASIONAL   Drug use: No   Sexual activity: Not on  file    Comment: VASECTOMY  Other Topics Concern   Not on file  Social History Narrative   Lives w/ wife   Social Drivers of Health   Financial Resource Strain: Low Risk  (06/12/2024)   Overall Financial Resource Strain (CARDIA)    Difficulty of Paying Living Expenses: Not very hard  Food Insecurity: No Food Insecurity (06/12/2024)   Hunger Vital Sign    Worried About Running Out of Food in the Last Year: Never true    Ran Out of Food in the Last Year: Never true  Transportation Needs: No Transportation Needs (06/12/2024)   PRAPARE - Administrator, Civil Service (Medical): No    Lack of Transportation (Non-Medical): No  Physical Activity: Insufficiently Active (06/12/2024)   Exercise Vital Sign    Days of Exercise per Week: 1 day    Minutes of Exercise per Session: 30 min  Stress: No Stress Concern Present (06/12/2024)   [employer] of Occupational Health - Occupational Stress Questionnaire    Feeling of Stress: Not at all  Social Connections: Moderately Integrated (06/12/2024)   Social Connection and Isolation Panel    Frequency of Communication with Friends and Family: Twice a week    Frequency of  Social Gatherings with Friends and Family: Once a week    Attends Religious Services: 1 to 4 times per year    Active Member of [employer] or Organizations: No    Attends Engineer, structural: Not on file    Marital Status: Married  Catering manager Violence: Not on file     Current Outpatient Medications  Medication Instructions   amLODipine  (NORVASC ) 10 MG tablet TAKE 1 TABLET(10 MG) BY MOUTH DAILY   dorzolamide-timolol (COSOPT) 2-0.5 % ophthalmic solution 1 drop, 2 times daily   levothyroxine  (SYNTHROID ) 100 mcg, Oral, Daily before breakfast   prednisoLONE acetate (PRED FORTE) 1 % ophthalmic suspension 1 drop, 6 times daily       Objective:   Physical Exam BP 126/70   Pulse (!) 54   Temp 97.9 F (36.6 C) (Oral)   Resp 16   Ht 5' 8 (1.727 m)   Wt 193 lb  4 oz (87.7 kg)   SpO2 97%   BMI 29.38 kg/m  General: Well developed, NAD, BMI noted Neck: No  thyromegaly  HEENT:  Normocephalic . Face symmetric, atraumatic Lungs:  CTA B Normal respiratory effort, no intercostal retractions, no accessory muscle use. Heart: RRR,  no murmur.  Abdomen:  Not distended, soft, non-tender. No rebound or rigidity.   Lower extremities: no pretibial edema bilaterally  Skin: Exposed areas without rash. Not pale. Not jaundice Neurologic:  alert & oriented X3.  Speech normal, gait appropriate for age and unassisted Strength symmetric and appropriate for age.  Psych: Cognition and judgment appear intact.  Cooperative with normal attention span and concentration.  Behavior appropriate. No anxious or depressed appearing.     Assessment   Assessment HTN started meds 2013 Migraine headaches Hypothyroidism: Started Synthroid  09-2018 OSA-- on Cpap Glaucoma, severe, R vision is blurred, L is OK (06/2024) BPH, elevated PSA , (-) prostate BX 2011, TURP 02-2017, sees urology   PLAN: Here for CPX Tdap 06/07/2023 PNM  23 : 2017; PNM 20 today (OSA). S/p Shingrix  Vaccines I recommend:  COVID booster, flu shot every fall CCS: scope 06/2018, no polyps, + int hemorrhoids, 10 years Prostate cancer screening: Sees urology, PSA 19 (04/22/2024, K PN) Labs:CMP FLP CBC TSH  Other issues HTN: On amlodipine , BP today is great, ambulatory BPs in the 130s.  No change, check labs Hypothyroidism: On Synthroid , check TSH OSA: Reports good CPAP compliance. CV risk: Although cholesterol is only 141 and BP is well-controlled his cardiovascular risk is moderately elevated.  No family history, no symptoms.  Reluctant to take a statin.  Calcium coronary score will help stratify his risk better.  He agreed to proceed. RTC 6 months

## 2024-06-12 NOTE — Assessment & Plan Note (Signed)
 Here for CPX Tdap 06/07/2023 PNM  23 : 2017; PNM 20 today (OSA). S/p Shingrix  Vaccines I recommend:  COVID booster, flu shot every fall CCS: scope 06/2018, no polyps, + int hemorrhoids, 10 years Prostate cancer screening: Sees urology, PSA 19 (04/22/2024, K PN) Labs:CMP FLP CBC TSH

## 2024-06-14 ENCOUNTER — Ambulatory Visit: Payer: Self-pay | Admitting: Internal Medicine

## 2024-06-18 ENCOUNTER — Ambulatory Visit (HOSPITAL_BASED_OUTPATIENT_CLINIC_OR_DEPARTMENT_OTHER)
Admission: RE | Admit: 2024-06-18 | Discharge: 2024-06-18 | Disposition: A | Discharge status: Home / Self Care | Payer: Self-pay | Source: Ambulatory Visit | Attending: Internal Medicine | Admitting: Internal Medicine

## 2024-06-18 DIAGNOSIS — Z136 Encounter for screening for cardiovascular disorders: Secondary | ICD-10-CM | POA: Insufficient documentation

## 2024-06-20 ENCOUNTER — Other Ambulatory Visit: Payer: Self-pay | Admitting: Internal Medicine

## 2024-07-21 ENCOUNTER — Other Ambulatory Visit: Payer: Self-pay | Admitting: Internal Medicine

## 2024-12-14 ENCOUNTER — Ambulatory Visit: Admitting: Internal Medicine
